# Patient Record
Sex: Male | Born: 1960 | Race: White | Hispanic: No | Smoking: Former smoker
Health system: Southern US, Community
[De-identification: ages and names within clinical notes are randomized; demographics above are authoritative.]

## PROBLEM LIST (undated history)

## (undated) DIAGNOSIS — G4733 Obstructive sleep apnea (adult) (pediatric): Secondary | ICD-10-CM

## (undated) HISTORY — DX: Obstructive sleep apnea (adult) (pediatric): G47.33

## (undated) HISTORY — DX: Morbid (severe) obesity due to excess calories: E66.01

---

## 2003-06-06 ENCOUNTER — Inpatient Hospital Stay (HOSPITAL_COMMUNITY): Admission: AC | Admit: 2003-06-06 | Discharge: 2003-06-09 | Payer: Self-pay
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
## 2012-03-13 LAB — HM COLONOSCOPY

## 2017-12-31 DIAGNOSIS — Z6841 Body Mass Index (BMI) 40.0 and over, adult: Secondary | ICD-10-CM | POA: Diagnosis not present

## 2017-12-31 DIAGNOSIS — R600 Localized edema: Secondary | ICD-10-CM | POA: Diagnosis not present

## 2017-12-31 DIAGNOSIS — Z125 Encounter for screening for malignant neoplasm of prostate: Secondary | ICD-10-CM | POA: Diagnosis not present

## 2017-12-31 DIAGNOSIS — Z0000 Encounter for general adult medical examination without abnormal findings: Secondary | ICD-10-CM | POA: Diagnosis not present

## 2018-01-19 DIAGNOSIS — L300 Nummular dermatitis: Secondary | ICD-10-CM | POA: Diagnosis not present

## 2018-01-19 DIAGNOSIS — I8310 Varicose veins of unspecified lower extremity with inflammation: Secondary | ICD-10-CM | POA: Diagnosis not present

## 2018-01-19 DIAGNOSIS — C4441 Basal cell carcinoma of skin of scalp and neck: Secondary | ICD-10-CM | POA: Diagnosis not present

## 2018-01-19 DIAGNOSIS — L578 Other skin changes due to chronic exposure to nonionizing radiation: Secondary | ICD-10-CM | POA: Diagnosis not present

## 2018-02-03 DIAGNOSIS — C4441 Basal cell carcinoma of skin of scalp and neck: Secondary | ICD-10-CM | POA: Diagnosis not present

## 2018-05-20 DIAGNOSIS — L570 Actinic keratosis: Secondary | ICD-10-CM | POA: Diagnosis not present

## 2018-05-21 DIAGNOSIS — H6012 Cellulitis of left external ear: Secondary | ICD-10-CM | POA: Diagnosis not present

## 2018-05-21 DIAGNOSIS — M25511 Pain in right shoulder: Secondary | ICD-10-CM | POA: Diagnosis not present

## 2018-07-11 DIAGNOSIS — K8020 Calculus of gallbladder without cholecystitis without obstruction: Secondary | ICD-10-CM | POA: Diagnosis not present

## 2018-07-11 DIAGNOSIS — R1011 Right upper quadrant pain: Secondary | ICD-10-CM | POA: Diagnosis not present

## 2018-07-11 DIAGNOSIS — R110 Nausea: Secondary | ICD-10-CM | POA: Diagnosis not present

## 2018-07-11 DIAGNOSIS — K7689 Other specified diseases of liver: Secondary | ICD-10-CM | POA: Diagnosis not present

## 2018-07-17 DIAGNOSIS — K8000 Calculus of gallbladder with acute cholecystitis without obstruction: Secondary | ICD-10-CM | POA: Diagnosis not present

## 2018-07-17 DIAGNOSIS — R1011 Right upper quadrant pain: Secondary | ICD-10-CM | POA: Diagnosis not present

## 2018-07-29 DIAGNOSIS — R1011 Right upper quadrant pain: Secondary | ICD-10-CM | POA: Diagnosis not present

## 2018-07-29 DIAGNOSIS — K8010 Calculus of gallbladder with chronic cholecystitis without obstruction: Secondary | ICD-10-CM | POA: Diagnosis not present

## 2018-07-29 DIAGNOSIS — I10 Essential (primary) hypertension: Secondary | ICD-10-CM | POA: Diagnosis not present

## 2018-07-29 DIAGNOSIS — R110 Nausea: Secondary | ICD-10-CM | POA: Diagnosis not present

## 2018-08-13 DIAGNOSIS — Z01810 Encounter for preprocedural cardiovascular examination: Secondary | ICD-10-CM | POA: Diagnosis not present

## 2018-08-13 DIAGNOSIS — K811 Chronic cholecystitis: Secondary | ICD-10-CM | POA: Diagnosis not present

## 2018-08-13 DIAGNOSIS — R109 Unspecified abdominal pain: Secondary | ICD-10-CM | POA: Diagnosis not present

## 2018-08-17 DIAGNOSIS — G4730 Sleep apnea, unspecified: Secondary | ICD-10-CM | POA: Diagnosis not present

## 2018-08-17 DIAGNOSIS — K8020 Calculus of gallbladder without cholecystitis without obstruction: Secondary | ICD-10-CM | POA: Diagnosis not present

## 2018-08-17 DIAGNOSIS — K8010 Calculus of gallbladder with chronic cholecystitis without obstruction: Secondary | ICD-10-CM | POA: Diagnosis not present

## 2018-08-17 DIAGNOSIS — K811 Chronic cholecystitis: Secondary | ICD-10-CM | POA: Diagnosis not present

## 2018-08-17 DIAGNOSIS — Z9989 Dependence on other enabling machines and devices: Secondary | ICD-10-CM | POA: Diagnosis not present

## 2018-08-17 DIAGNOSIS — I10 Essential (primary) hypertension: Secondary | ICD-10-CM | POA: Diagnosis not present

## 2018-12-16 DIAGNOSIS — L570 Actinic keratosis: Secondary | ICD-10-CM | POA: Diagnosis not present

## 2019-03-09 DIAGNOSIS — H60539 Acute contact otitis externa, unspecified ear: Secondary | ICD-10-CM | POA: Diagnosis not present

## 2019-08-09 DIAGNOSIS — Z6841 Body Mass Index (BMI) 40.0 and over, adult: Secondary | ICD-10-CM | POA: Diagnosis not present

## 2019-08-09 DIAGNOSIS — G4733 Obstructive sleep apnea (adult) (pediatric): Secondary | ICD-10-CM | POA: Diagnosis not present

## 2019-08-09 DIAGNOSIS — J4520 Mild intermittent asthma, uncomplicated: Secondary | ICD-10-CM | POA: Diagnosis not present

## 2019-08-09 DIAGNOSIS — Z1159 Encounter for screening for other viral diseases: Secondary | ICD-10-CM | POA: Diagnosis not present

## 2019-08-27 DIAGNOSIS — G4733 Obstructive sleep apnea (adult) (pediatric): Secondary | ICD-10-CM | POA: Diagnosis not present

## 2019-08-27 DIAGNOSIS — J4530 Mild persistent asthma, uncomplicated: Secondary | ICD-10-CM | POA: Diagnosis not present

## 2019-08-27 DIAGNOSIS — E662 Morbid (severe) obesity with alveolar hypoventilation: Secondary | ICD-10-CM | POA: Diagnosis not present

## 2019-08-27 DIAGNOSIS — R5383 Other fatigue: Secondary | ICD-10-CM | POA: Diagnosis not present

## 2019-09-06 DIAGNOSIS — M940 Chondrocostal junction syndrome [Tietze]: Secondary | ICD-10-CM | POA: Diagnosis not present

## 2019-09-11 DIAGNOSIS — G4733 Obstructive sleep apnea (adult) (pediatric): Secondary | ICD-10-CM | POA: Diagnosis not present

## 2019-09-15 DIAGNOSIS — J4530 Mild persistent asthma, uncomplicated: Secondary | ICD-10-CM | POA: Diagnosis not present

## 2019-09-15 DIAGNOSIS — R5383 Other fatigue: Secondary | ICD-10-CM | POA: Diagnosis not present

## 2019-09-15 DIAGNOSIS — E662 Morbid (severe) obesity with alveolar hypoventilation: Secondary | ICD-10-CM | POA: Diagnosis not present

## 2019-09-15 DIAGNOSIS — G4733 Obstructive sleep apnea (adult) (pediatric): Secondary | ICD-10-CM | POA: Diagnosis not present

## 2019-10-09 DIAGNOSIS — G4733 Obstructive sleep apnea (adult) (pediatric): Secondary | ICD-10-CM | POA: Diagnosis not present

## 2019-10-15 DIAGNOSIS — J4530 Mild persistent asthma, uncomplicated: Secondary | ICD-10-CM | POA: Diagnosis not present

## 2019-10-15 DIAGNOSIS — R5383 Other fatigue: Secondary | ICD-10-CM | POA: Diagnosis not present

## 2019-10-15 DIAGNOSIS — G4733 Obstructive sleep apnea (adult) (pediatric): Secondary | ICD-10-CM | POA: Diagnosis not present

## 2019-10-15 DIAGNOSIS — E662 Morbid (severe) obesity with alveolar hypoventilation: Secondary | ICD-10-CM | POA: Diagnosis not present

## 2019-10-17 DIAGNOSIS — M26629 Arthralgia of temporomandibular joint, unspecified side: Secondary | ICD-10-CM | POA: Diagnosis not present

## 2019-11-07 DIAGNOSIS — J4520 Mild intermittent asthma, uncomplicated: Secondary | ICD-10-CM | POA: Insufficient documentation

## 2019-11-07 DIAGNOSIS — G4733 Obstructive sleep apnea (adult) (pediatric): Secondary | ICD-10-CM | POA: Insufficient documentation

## 2019-11-08 ENCOUNTER — Ambulatory Visit: Payer: Self-pay | Admitting: Legal Medicine

## 2019-11-10 DIAGNOSIS — G4733 Obstructive sleep apnea (adult) (pediatric): Secondary | ICD-10-CM | POA: Diagnosis not present

## 2019-12-10 DIAGNOSIS — G4733 Obstructive sleep apnea (adult) (pediatric): Secondary | ICD-10-CM | POA: Diagnosis not present

## 2019-12-15 DIAGNOSIS — E662 Morbid (severe) obesity with alveolar hypoventilation: Secondary | ICD-10-CM | POA: Diagnosis not present

## 2019-12-15 DIAGNOSIS — G4733 Obstructive sleep apnea (adult) (pediatric): Secondary | ICD-10-CM | POA: Diagnosis not present

## 2019-12-15 DIAGNOSIS — J4530 Mild persistent asthma, uncomplicated: Secondary | ICD-10-CM | POA: Diagnosis not present

## 2019-12-15 DIAGNOSIS — R5383 Other fatigue: Secondary | ICD-10-CM | POA: Diagnosis not present

## 2020-01-10 DIAGNOSIS — G4733 Obstructive sleep apnea (adult) (pediatric): Secondary | ICD-10-CM | POA: Diagnosis not present

## 2020-01-17 ENCOUNTER — Ambulatory Visit: Payer: BC Managed Care – PPO | Admitting: Legal Medicine

## 2020-01-17 ENCOUNTER — Encounter: Payer: Self-pay | Admitting: Legal Medicine

## 2020-01-17 ENCOUNTER — Other Ambulatory Visit: Payer: Self-pay

## 2020-01-17 VITALS — BP 130/80 | HR 102 | Temp 97.4°F | Resp 16 | Ht 70.0 in | Wt >= 6400 oz

## 2020-01-17 DIAGNOSIS — R609 Edema, unspecified: Secondary | ICD-10-CM | POA: Diagnosis not present

## 2020-01-17 MED ORDER — FUROSEMIDE 20 MG PO TABS: 20.0000 mg | ORAL_TABLET | Freq: Every day | ORAL | 2 refills | Status: DC

## 2020-01-17 NOTE — Progress Notes (Signed)
Subjective:  Patient ID: Victor Hamilton, male    DOB: 06-Nov-1960  Age: 59 y.o. MRN: [id number]  Chief Complaint  Patient presents with  . Edema    HPI: peripheral edema.  Worse with warm weather.  He is cutting down on salt but remains overweight.   Current Outpatient Medications on File Prior to Visit  Medication Sig Dispense Refill  . albuterol (VENTOLIN HFA) 108 (90 Base) MCG/ACT inhaler Inhale into the lungs.    Marland Kitchen BREO ELLIPTA 100-25 MCG/INH AEPB SMARTSIG:1 Inhalation Via Inhaler Daily    . cyanocobalamin 1000 MCG tablet Take by mouth.    . loratadine (CLARITIN) 10 MG tablet Take by mouth.    . Potassium Gluconate 2.5 MEQ TABS Take by mouth.     No current facility-administered medications on file prior to visit.   Past Medical History:  Diagnosis Date  . Morbid obesity due to excess calories (HCC)   . Obstructive sleep apnea (adult) (pediatric)    History reviewed. No pertinent surgical history.  Family History  Problem Relation Age of Onset  . Transient ischemic attack Mother   . Hypertension Father   . Heart attack Father   . Diabetes Father    Social History   Socioeconomic History  . Marital status: Divorced    Spouse name: Not on file  . Number of children: 1  . Years of education: Not on file  . Highest education level: Not on file  Occupational History  . Not on file  Tobacco Use  . Smoking status: Never Smoker  . Smokeless tobacco: Never Used  Vaping Use  . Vaping Use: Never used  Substance and Sexual Activity  . Alcohol use: Not Currently  . Drug use: Not Currently  . Sexual activity: Not on file  Other Topics Concern  . Not on file  Social History Narrative  . Not on file   Social Determinants of Health   Financial Resource Strain:   . Difficulty of Paying Living Expenses:   Food Insecurity:   . Worried About Programme researcher, broadcasting/film/video in the Last Year:   . Barista in the Last Year:   Transportation Needs:   . Freight forwarder  (Medical):   Marland Kitchen Lack of Transportation (Non-Medical):   Physical Activity:   . Days of Exercise per Week:   . Minutes of Exercise per Session:   Stress:   . Feeling of Stress :   Social Connections:   . Frequency of Communication with Friends and Family:   . Frequency of Social Gatherings with Friends and Family:   . Attends Religious Services:   . Active Member of Clubs or Organizations:   . Attends Banker Meetings:   Marland Kitchen Marital Status:     Review of Systems  Constitutional: Negative.   HENT: Negative.   Eyes: Negative.   Respiratory: Negative.   Cardiovascular: Negative.   Gastrointestinal: Negative.   Genitourinary: Negative.   Musculoskeletal:       Peripheral edema  Neurological: Negative.   Psychiatric/Behavioral: Negative.      Objective:  BP 130/80   Pulse (!) 102   Temp (!) 97.4 F (36.3 C)   Resp 16   Ht 5\' 10"  (1.778 m)   Wt (!) 408 lb (185.1 kg)   SpO2 93%   BMI 58.54 kg/m   BP/Weight 01/17/2020  Systolic BP 130  Diastolic BP 80  Wt. (Lbs) 408  BMI 58.54    Physical Exam  Vitals reviewed.  Constitutional:      Appearance: Normal appearance.  Cardiovascular:     Rate and Rhythm: Normal rate and regular rhythm.     Pulses: Normal pulses.     Heart sounds: Normal heart sounds.  Pulmonary:     Effort: Pulmonary effort is normal.     Breath sounds: Normal breath sounds.  Musculoskeletal:     Cervical back: Normal range of motion and neck supple.  Skin:    General: Skin is warm and dry.     Comments: 2+ pitting edema both legs  Neurological:     Mental Status: He is alert.       No results found for: WBC, HGB, HCT, PLT, GLUCOSE, CHOL, TRIG, HDL, LDLDIRECT, LDLCALC, ALT, AST, NA, K, CL, CREATININE, BUN, CO2, TSH, PSA, INR, GLUF, HGBA1C, MICROALBUR    Assessment & Plan:   1. Morbid obesity due to excess calories [facility]) An individualize plan was formulated for obesity using patient history and physical exam to encourage  weight loss.  An evidence based program was formulated.  Patient is to cut portion size with meals and to plan physical exercise 3 days a week at least 20 minutes.  Weight watchers and other programs are helpful.  Planned amount of weight loss 10 lbs.  2. Peripheral edema - furosemide (LASIX) 20 MG tablet; Take 1 tablet (20 mg total) by mouth daily.  Dispense: 30 tablet; Refill: 2 3+ edema both legs, start diuretic and get compression hose.  I gave him note to sit every 2 hours due to weight      Meds ordered this encounter  Medications  . furosemide (LASIX) 20 MG tablet    Sig: Take 1 tablet (20 mg total) by mouth daily.    Dispense:  30 tablet    Refill:  2    No orders of the defined types were placed in this encounter.    Follow-up: Return in about 3 months (around 04/18/2020) for fasting.  An After Visit Summary was printed and given to the patient.  Brent Bulla [facility] [phone]

## 2020-02-09 DIAGNOSIS — G4733 Obstructive sleep apnea (adult) (pediatric): Secondary | ICD-10-CM | POA: Diagnosis not present

## 2020-03-11 DIAGNOSIS — G4733 Obstructive sleep apnea (adult) (pediatric): Secondary | ICD-10-CM | POA: Diagnosis not present

## 2020-03-21 DIAGNOSIS — G4733 Obstructive sleep apnea (adult) (pediatric): Secondary | ICD-10-CM | POA: Diagnosis not present

## 2020-04-09 ENCOUNTER — Other Ambulatory Visit: Payer: Self-pay | Admitting: Legal Medicine

## 2020-04-09 DIAGNOSIS — R609 Edema, unspecified: Secondary | ICD-10-CM

## 2020-04-11 DIAGNOSIS — G4733 Obstructive sleep apnea (adult) (pediatric): Secondary | ICD-10-CM | POA: Diagnosis not present

## 2020-04-19 ENCOUNTER — Ambulatory Visit: Payer: BC Managed Care – PPO | Admitting: Legal Medicine

## 2020-05-11 DIAGNOSIS — G4733 Obstructive sleep apnea (adult) (pediatric): Secondary | ICD-10-CM | POA: Diagnosis not present

## 2020-05-25 ENCOUNTER — Ambulatory Visit: Payer: BC Managed Care – PPO | Admitting: Legal Medicine

## 2020-05-25 ENCOUNTER — Encounter: Payer: Self-pay | Admitting: Legal Medicine

## 2020-05-25 ENCOUNTER — Other Ambulatory Visit: Payer: Self-pay

## 2020-05-25 VITALS — BP 128/80 | HR 96 | Temp 97.4°F | Resp 16 | Ht 70.0 in | Wt >= 6400 oz

## 2020-05-25 DIAGNOSIS — Z13220 Encounter for screening for lipoid disorders: Secondary | ICD-10-CM | POA: Diagnosis not present

## 2020-05-25 DIAGNOSIS — Z23 Encounter for immunization: Secondary | ICD-10-CM

## 2020-05-25 DIAGNOSIS — J4520 Mild intermittent asthma, uncomplicated: Secondary | ICD-10-CM

## 2020-05-25 DIAGNOSIS — G4733 Obstructive sleep apnea (adult) (pediatric): Secondary | ICD-10-CM

## 2020-05-25 NOTE — Progress Notes (Signed)
Subjective:  Patient ID: Victor Hamilton, male    DOB: 1960/10/08  Age: 59 y.o. MRN: [id number]  Chief Complaint  Patient presents with  . Edema    3 month follow up, both legs still swollen    HPI: chronic visit  Patient has moderate intermettant asthma uncomplicated.  Asthma was diagnosed adult and has frequent daytime episodes and frequent night symptoms.  Patient is no having an acute attack and is using breo, albuterol.  Patient nonsmoker.  None  OSA and on bipap and using regularly.  He is doing better and more awake  Chronic venous hypertension with chronic edema and using compression hose and furosemide.    Current Outpatient Medications on File Prior to Visit  Medication Sig Dispense Refill  . albuterol (VENTOLIN HFA) 108 (90 Base) MCG/ACT inhaler Inhale into the lungs.    Marland Kitchen BREO ELLIPTA 100-25 MCG/INH AEPB SMARTSIG:1 Inhalation Via Inhaler Daily    . cyanocobalamin 1000 MCG tablet Take by mouth.    . furosemide (LASIX) 20 MG tablet TAKE 1 TABLET BY MOUTH EVERY DAY 90 tablet 2  . loratadine (CLARITIN) 10 MG tablet Take by mouth.    . Potassium Gluconate 2.5 MEQ TABS Take by mouth.     No current facility-administered medications on file prior to visit.   Past Medical History:  Diagnosis Date  . Morbid obesity due to excess calories (HCC)   . Obstructive sleep apnea (adult) (pediatric)    History reviewed. No pertinent surgical history.  Family History  Problem Relation Age of Onset  . Transient ischemic attack Mother   . Hypertension Father   . Heart attack Father   . Diabetes Father    Social History   Socioeconomic History  . Marital status: Divorced    Spouse name: Not on file  . Number of children: 1  . Years of education: Not on file  . Highest education level: Not on file  Occupational History  . Not on file  Tobacco Use  . Smoking status: Never Smoker  . Smokeless tobacco: Never Used  Vaping Use  . Vaping Use: Never used  Substance and Sexual  Activity  . Alcohol use: Not Currently  . Drug use: Not Currently  . Sexual activity: Not on file  Other Topics Concern  . Not on file  Social History Narrative  . Not on file   Social Determinants of Health   Financial Resource Strain:   . Difficulty of Paying Living Expenses: Not on file  Food Insecurity:   . Worried About Programme researcher, broadcasting/film/video in the Last Year: Not on file  . Ran Out of Food in the Last Year: Not on file  Transportation Needs:   . Lack of Transportation (Medical): Not on file  . Lack of Transportation (Non-Medical): Not on file  Physical Activity:   . Days of Exercise per Week: Not on file  . Minutes of Exercise per Session: Not on file  Stress:   . Feeling of Stress : Not on file  Social Connections:   . Frequency of Communication with Friends and Family: Not on file  . Frequency of Social Gatherings with Friends and Family: Not on file  . Attends Religious Services: Not on file  . Active Member of Clubs or Organizations: Not on file  . Attends Banker Meetings: Not on file  . Marital Status: Not on file    Review of Systems  Constitutional: Negative for activity change and appetite change.  Eyes: Negative.   Respiratory: Positive for shortness of breath.   Cardiovascular: Positive for leg swelling. Negative for chest pain and palpitations.  Gastrointestinal: Negative.   Endocrine: Negative.   Genitourinary: Negative.   Musculoskeletal: Negative.   Skin: Negative.   Neurological: Negative.   Psychiatric/Behavioral: Negative.      Objective:  BP 128/80   Pulse 96   Temp (!) 97.4 F (36.3 C)   Resp 16   Ht 5\' 10"  (1.778 m)   Wt (!) 404 lb 3.2 oz (183.3 kg)   SpO2 100%   BMI 58.00 kg/m   BP/Weight 05/25/2020 01/17/2020  Systolic BP 128 130  Diastolic BP 80 80  Wt. (Lbs) 404.2 408  BMI 58 58.54    Physical Exam Vitals reviewed.  Constitutional:      Appearance: Normal appearance.  HENT:     Head: Normocephalic and  atraumatic.     Right Ear: Tympanic membrane, ear canal and external ear normal.     Left Ear: Tympanic membrane, ear canal and external ear normal.     Mouth/Throat:     Mouth: Mucous membranes are moist.     Pharynx: Oropharynx is clear.  Eyes:     Extraocular Movements: Extraocular movements intact.     Conjunctiva/sclera: Conjunctivae normal.     Pupils: Pupils are equal, round, and reactive to light.  Cardiovascular:     Rate and Rhythm: Normal rate and regular rhythm.     Pulses: Normal pulses.     Heart sounds: Normal heart sounds.  Pulmonary:     Effort: Pulmonary effort is normal.     Breath sounds: Normal breath sounds.  Abdominal:     General: Abdomen is flat. Bowel sounds are normal.     Palpations: Abdomen is soft.  Musculoskeletal:        General: Normal range of motion.     Cervical back: Normal range of motion and neck supple.  Skin:    General: Skin is warm and dry.     Capillary Refill: Capillary refill takes less than 2 seconds.     Comments: Diffuse edema  Neurological:     General: No focal deficit present.     Mental Status: He is alert. Mental status is at baseline. He is disoriented.  Psychiatric:        Mood and Affect: Mood normal.        Thought Content: Thought content normal.       No results found for: WBC, HGB, HCT, PLT, GLUCOSE, CHOL, TRIG, HDL, LDLDIRECT, LDLCALC, ALT, AST, NA, K, CL, CREATININE, BUN, CO2, TSH, PSA, INR, GLUF, HGBA1C, MICROALBUR    Assessment & Plan:   1. Mild intermittent intrinsic asthma without status asthmaticus without complication - CBC with Differential/Platelet - Comprehensive metabolic panel This patient has asthma moderate and is on breo.  Patient is not having a flair.  Chronic medicines include breo and albuterol. Addition new medicines none.  Asthma action plan is in place.   2. Obstructive sleep apnea (adult) (pediatric) - CBC with Differential/Platelet - Comprehensive metabolic panel PATIENT HAS  SEVERE SLEEP APNEA AND IS ON BIPAP  3. Morbid obesity (HCC) An individualize plan was formulated for obesity using patient history and physical exam to encourage weight loss.  An evidence based program was formulated.  Patient is to cut portion size with meals and to plan physical exercise 3 days a week at least 20 minutes.  Weight watchers and other programs are helpful.  Planned amount  of weight loss 10 lbs.  4. Screening cholesterol level - Lipid panel         Follow-up: Return in about 6 months (around 11/22/2020) for fasting.  An After Visit Summary was printed and given to the patient.  Brent Bulla [facility] [phone]

## 2020-05-26 ENCOUNTER — Encounter: Payer: Self-pay | Admitting: Legal Medicine

## 2020-05-26 LAB — CBC WITH DIFFERENTIAL/PLATELET
Basophils Absolute: 0.1 10*3/uL (ref 0.0–0.2)
Basos: 1 %
EOS (ABSOLUTE): 0.3 10*3/uL (ref 0.0–0.4)
Eos: 3 %
Hematocrit: 43 % (ref 37.5–51.0)
Hemoglobin: 14.2 g/dL (ref 13.0–17.7)
Immature Grans (Abs): 0 10*3/uL (ref 0.0–0.1)
Immature Granulocytes: 0 %
Lymphocytes Absolute: 1.1 10*3/uL (ref 0.7–3.1)
Lymphs: 13 %
MCH: 27.8 pg (ref 26.6–33.0)
MCHC: 33 g/dL (ref 31.5–35.7)
MCV: 84 fL (ref 79–97)
Monocytes Absolute: 0.9 10*3/uL (ref 0.1–0.9)
Monocytes: 11 %
Neutrophils Absolute: 5.7 10*3/uL (ref 1.4–7.0)
Neutrophils: 72 %
Platelets: 280 10*3/uL (ref 150–450)
RBC: 5.11 x10E6/uL (ref 4.14–5.80)
RDW: 13.6 % (ref 11.6–15.4)
WBC: 8 10*3/uL (ref 3.4–10.8)

## 2020-05-26 LAB — COMPREHENSIVE METABOLIC PANEL
ALT: 20 IU/L (ref 0–44)
AST: 18 IU/L (ref 0–40)
Albumin/Globulin Ratio: 1.4 (ref 1.2–2.2)
Albumin: 3.9 g/dL (ref 3.8–4.9)
Alkaline Phosphatase: 87 IU/L (ref 44–121)
BUN/Creatinine Ratio: 14 (ref 9–20)
BUN: 13 mg/dL (ref 6–24)
Bilirubin Total: 0.3 mg/dL (ref 0.0–1.2)
CO2: 26 mmol/L (ref 20–29)
Calcium: 8.7 mg/dL (ref 8.7–10.2)
Chloride: 102 mmol/L (ref 96–106)
Creatinine, Ser: 0.93 mg/dL (ref 0.76–1.27)
GFR calc Af Amer: 104 mL/min/{1.73_m2} (ref >59)
GFR calc non Af Amer: 90 mL/min/{1.73_m2} (ref >59)
Globulin, Total: 2.8 g/dL (ref 1.5–4.5)
Glucose: 111 mg/dL — ABNORMAL HIGH (ref 65–99)
Potassium: 5.2 mmol/L (ref 3.5–5.2)
Sodium: 140 mmol/L (ref 134–144)
Total Protein: 6.7 g/dL (ref 6.0–8.5)

## 2020-05-26 LAB — LIPID PANEL
Chol/HDL Ratio: 4 ratio (ref 0.0–5.0)
Cholesterol, Total: 137 mg/dL (ref 100–199)
HDL: 34 mg/dL — ABNORMAL LOW (ref >39)
LDL Chol Calc (NIH): 88 mg/dL (ref 0–99)
Triglycerides: 75 mg/dL (ref 0–149)
VLDL Cholesterol Cal: 15 mg/dL (ref 5–40)

## 2020-05-26 LAB — CARDIOVASCULAR RISK ASSESSMENT

## 2020-05-26 NOTE — Progress Notes (Signed)
CBC normal, glucose 111, kidney and liver tests normal, Cholesterol normal,  lp

## 2020-05-29 LAB — HGB A1C W/O EAG: Hgb A1c MFr Bld: 6.2 % — ABNORMAL HIGH (ref 4.8–5.6)

## 2020-05-29 LAB — SPECIMEN STATUS REPORT

## 2020-05-29 NOTE — Progress Notes (Signed)
A1c 6.2 prediabetes level, needs nurse visit to discuss glucose management

## 2020-05-30 ENCOUNTER — Telehealth: Payer: Self-pay

## 2020-05-30 NOTE — Telephone Encounter (Signed)
I left voicemail to call us back for the results of HbA1C.

## 2020-05-31 DIAGNOSIS — R5383 Other fatigue: Secondary | ICD-10-CM | POA: Diagnosis not present

## 2020-05-31 DIAGNOSIS — E662 Morbid (severe) obesity with alveolar hypoventilation: Secondary | ICD-10-CM | POA: Diagnosis not present

## 2020-05-31 DIAGNOSIS — J4530 Mild persistent asthma, uncomplicated: Secondary | ICD-10-CM | POA: Diagnosis not present

## 2020-05-31 DIAGNOSIS — G4733 Obstructive sleep apnea (adult) (pediatric): Secondary | ICD-10-CM | POA: Diagnosis not present

## 2020-06-23 DIAGNOSIS — G4733 Obstructive sleep apnea (adult) (pediatric): Secondary | ICD-10-CM | POA: Diagnosis not present

## 2020-07-09 ENCOUNTER — Encounter: Payer: Self-pay | Admitting: Legal Medicine

## 2020-07-11 DIAGNOSIS — G4733 Obstructive sleep apnea (adult) (pediatric): Secondary | ICD-10-CM | POA: Diagnosis not present

## 2020-07-31 DIAGNOSIS — R059 Cough, unspecified: Secondary | ICD-10-CM | POA: Diagnosis not present

## 2020-07-31 DIAGNOSIS — J069 Acute upper respiratory infection, unspecified: Secondary | ICD-10-CM | POA: Diagnosis not present

## 2020-07-31 DIAGNOSIS — K591 Functional diarrhea: Secondary | ICD-10-CM | POA: Diagnosis not present

## 2020-07-31 DIAGNOSIS — A78 Q fever: Secondary | ICD-10-CM | POA: Diagnosis not present

## 2020-07-31 DIAGNOSIS — R519 Headache, unspecified: Secondary | ICD-10-CM | POA: Diagnosis not present

## 2020-07-31 DIAGNOSIS — M791 Myalgia, unspecified site: Secondary | ICD-10-CM | POA: Diagnosis not present

## 2020-07-31 DIAGNOSIS — Z20828 Contact with and (suspected) exposure to other viral communicable diseases: Secondary | ICD-10-CM | POA: Diagnosis not present

## 2020-07-31 DIAGNOSIS — R197 Diarrhea, unspecified: Secondary | ICD-10-CM | POA: Diagnosis not present

## 2020-08-11 DIAGNOSIS — G4733 Obstructive sleep apnea (adult) (pediatric): Secondary | ICD-10-CM | POA: Diagnosis not present

## 2020-10-16 DIAGNOSIS — G4733 Obstructive sleep apnea (adult) (pediatric): Secondary | ICD-10-CM | POA: Diagnosis not present

## 2020-11-30 ENCOUNTER — Ambulatory Visit: Payer: BC Managed Care – PPO | Admitting: Legal Medicine

## 2020-12-20 ENCOUNTER — Encounter: Payer: Self-pay | Admitting: Legal Medicine

## 2020-12-20 ENCOUNTER — Other Ambulatory Visit: Payer: Self-pay

## 2020-12-20 ENCOUNTER — Ambulatory Visit: Payer: BC Managed Care – PPO | Admitting: Legal Medicine

## 2020-12-20 ENCOUNTER — Ambulatory Visit (INDEPENDENT_AMBULATORY_CARE_PROVIDER_SITE_OTHER): Payer: BC Managed Care – PPO

## 2020-12-20 VITALS — BP 148/90 | HR 84 | Temp 97.5°F | Ht 70.0 in | Wt >= 6400 oz

## 2020-12-20 DIAGNOSIS — R609 Edema, unspecified: Secondary | ICD-10-CM

## 2020-12-20 DIAGNOSIS — Z23 Encounter for immunization: Secondary | ICD-10-CM | POA: Diagnosis not present

## 2020-12-20 DIAGNOSIS — N400 Enlarged prostate without lower urinary tract symptoms: Secondary | ICD-10-CM | POA: Insufficient documentation

## 2020-12-20 DIAGNOSIS — J4520 Mild intermittent asthma, uncomplicated: Secondary | ICD-10-CM | POA: Diagnosis not present

## 2020-12-20 DIAGNOSIS — G4733 Obstructive sleep apnea (adult) (pediatric): Secondary | ICD-10-CM

## 2020-12-20 DIAGNOSIS — Z6841 Body Mass Index (BMI) 40.0 and over, adult: Secondary | ICD-10-CM | POA: Insufficient documentation

## 2020-12-20 MED ORDER — ALBUTEROL SULFATE HFA 108 (90 BASE) MCG/ACT IN AERS: 2.0000 | INHALATION_SPRAY | Freq: Four times a day (QID) | RESPIRATORY_TRACT | 6 refills | Status: DC | PRN

## 2020-12-20 NOTE — Progress Notes (Signed)
Subjective:  Patient ID: Victor Hamilton, male    DOB: [birth year]  Age: 60 y.o. MRN: [id number]  Chief Complaint  Patient presents with  . Asthma    HPI: chronic visit  Patient has moderate intermittant asthma uncomplicated.  Asthma was diagnosed child and has ocassional daytime episodes and ocassional night symptoms.  Patient is not having an attack and is using breo and albuterol.  Patient does not soke.  None  Sleep apnea doing well on BiPAP   Current Outpatient Medications on File Prior to Visit  Medication Sig Dispense Refill  . BREO ELLIPTA 100-25 MCG/INH AEPB SMARTSIG:1 Inhalation Via Inhaler Daily    . cyanocobalamin 1000 MCG tablet Take by mouth.    . furosemide (LASIX) 20 MG tablet TAKE 1 TABLET BY MOUTH EVERY DAY 90 tablet 2  . loratadine (CLARITIN) 10 MG tablet Take by mouth.    . Potassium Gluconate 2.5 MEQ TABS Take by mouth.     No current facility-administered medications on file prior to visit.   Past Medical History:  Diagnosis Date  . Morbid obesity due to excess calories (HCC)   . Obstructive sleep apnea (adult) (pediatric)    History reviewed. No pertinent surgical history.  Family History  Problem Relation Age of Onset  . Transient ischemic attack Mother   . Hypertension Father   . Heart attack Father   . Diabetes Father    Social History   Socioeconomic History  . Marital status: Divorced    Spouse name: Not on file  . Number of children: 1  . Years of education: Not on file  . Highest education level: Not on file  Occupational History  . Not on file  Tobacco Use  . Smoking status: Never Smoker  . Smokeless tobacco: Never Used  Vaping Use  . Vaping Use: Never used  Substance and Sexual Activity  . Alcohol use: Not Currently  . Drug use: Not Currently  . Sexual activity: Not on file  Other Topics Concern  . Not on file  Social History Narrative  . Not on file   Social Determinants of Health   Financial Resource Strain: Not on file   Food Insecurity: Not on file  Transportation Needs: Not on file  Physical Activity: Not on file  Stress: Not on file  Social Connections: Not on file    Review of Systems  Constitutional: Negative for chills, diaphoresis, fatigue and fever.  HENT: Negative for congestion, ear pain and sore throat.   Eyes: Negative for visual disturbance.  Respiratory: Negative for cough and shortness of breath.   Cardiovascular: Negative for chest pain and leg swelling.  Gastrointestinal: Negative for abdominal pain, constipation, diarrhea, nausea and vomiting.  Endocrine: Negative for polyuria.  Genitourinary: Negative for dysuria and urgency.  Musculoskeletal: Negative for arthralgias and myalgias.  Skin: Negative.   Neurological: Negative for dizziness and headaches.  Hematological: Negative.   Psychiatric/Behavioral: Negative for dysphoric mood.     Objective:  BP (!) 148/90   Pulse 84   Temp (!) 97.5 F (36.4 C)   Ht 5\' 10"  (1.778 m)   Wt (!) 412 lb (186.9 kg)   SpO2 93%   BMI 59.12 kg/m   BP/Weight 12/20/2020 05/25/2020 01/17/2020  Systolic BP 148 128 130  Diastolic BP 90 80 80  Wt. (Lbs) 412 404.2 408  BMI 59.12 58 58.54    Physical Exam Vitals reviewed.  Constitutional:      Appearance: Normal appearance. He is obese.  HENT:     Head: Normocephalic and atraumatic.     Right Ear: Tympanic membrane normal.     Left Ear: Tympanic membrane normal.     Nose: Nose normal.     Mouth/Throat:     Mouth: Mucous membranes are moist.     Pharynx: Oropharynx is clear.  Eyes:     Extraocular Movements: Extraocular movements intact.     Conjunctiva/sclera: Conjunctivae normal.     Pupils: Pupils are equal, round, and reactive to light.  Cardiovascular:     Rate and Rhythm: Normal rate and regular rhythm.     Pulses: Normal pulses.     Heart sounds: No murmur heard. No gallop.   Pulmonary:     Effort: Pulmonary effort is normal. No respiratory distress.     Breath sounds: No  rales.  Abdominal:     General: Abdomen is flat. Bowel sounds are normal. There is no distension.     Palpations: Abdomen is soft.     Tenderness: There is no abdominal tenderness.  Musculoskeletal:        General: Normal range of motion.     Cervical back: Normal range of motion.     Right lower leg: Edema present.     Left lower leg: Edema present.  Skin:    General: Skin is warm.     Capillary Refill: Capillary refill takes less than 2 seconds.  Neurological:     General: No focal deficit present.     Mental Status: He is alert and oriented to person, place, and time. Mental status is at baseline.  Psychiatric:        Mood and Affect: Mood normal.        Thought Content: Thought content normal.        Judgment: Judgment normal.       Lab Results  Component Value Date   WBC 8.0 05/25/2020   HGB 14.2 05/25/2020   HCT 43.0 05/25/2020   PLT 280 05/25/2020   GLUCOSE 111 (H) 05/25/2020   CHOL 137 05/25/2020   TRIG 75 05/25/2020   HDL 34 (L) 05/25/2020   LDLCALC 88 05/25/2020   ALT 20 05/25/2020   AST 18 05/25/2020   NA 140 05/25/2020   K 5.2 05/25/2020   CL 102 05/25/2020   CREATININE 0.93 05/25/2020   BUN 13 05/25/2020   CO2 26 05/25/2020   HGBA1C 6.2 (H) 05/25/2020      Assessment & Plan:   Diagnoses and all orders for this visit: Mild intermittent intrinsic asthma without status asthmaticus without complication -     albuterol (VENTOLIN HFA) 108 (90 Base) MCG/ACT inhaler; Inhale 2 puffs into the lungs every 6 (six) hours as needed for wheezing or shortness of breath. -     CBC with Differential/Platelet -     Comprehensive metabolic panel This patient has asthma moderate and is on breo and albuterol.  Patient is not having a flair.  Chronic medicines include breo. Addition new medicines none.  Asthma action plan is in place.   Obstructive sleep apnea (adult) (pediatric) -     CBC with Differential/Platelet Using CPAP consistently every night and medically  benefiting from its use.  Peripheral edema -     Comprehensive metabolic panel Patient has some edema but it is  controllable.  Morbid obesity due to excess calories (HCC) -     CBC with Differential/Platelet -     Comprehensive metabolic panel An individualize plan was formulated for  obesity using patient history and physical exam to encourage weight loss.  An evidence based program was formulated.  Patient is to cut portion size with meals and to plan physical exercise 3 days a week at least 20 minutes.  Weight watchers and other programs are helpful.  Planned amount of weight loss 10 lbs.  BMI 50.0-59.9, adult [facility]) An individualize plan was formulated for obesity using patient history and physical exam to encourage weight loss.  An evidence based program was formulated.  Patient is to cut portion size with meals and to plan physical exercise 3 days a week at least 20 minutes.  Weight watchers and other programs are helpful.  Planned amount of weight loss 10 lbs. We dis discuss weight loss  Benign prostatic hyperplasia without lower urinary tract symptoms -     PSA BPH with no symptoms       Follow-up: Return in about 6 months (around 06/21/2021).  An After Visit Summary was printed and given to the patient.  Brent Bulla, MD [facility] [phone]

## 2020-12-20 NOTE — Progress Notes (Signed)
   Covid-19 Vaccination Clinic  Name:  Victor Hamilton    MRN: [id number] DOB: [birth year]  12/20/2020  Victor Hamilton was observed post Covid-19 immunization for 15 minutes without incident. He was provided with Vaccine Information Sheet and instruction to access the V-Safe system.   Victor Hamilton was instructed to call 911 with any severe reactions post vaccine: Marland Kitchen Difficulty breathing  . Swelling of face and throat  . A fast heartbeat  . A bad rash all over body  . Dizziness and weakness   Immunizations Administered    Name Date Dose VIS Date Route   PFIZER Comrnaty(Gray TOP) Covid-19 Vaccine 12/20/2020  8:43 AM 0.3 mL 06/29/2020 Intramuscular   Manufacturer: [employer], [employer]   Lot: [id number]   NDC: [phone]

## 2020-12-21 LAB — CBC WITH DIFFERENTIAL/PLATELET
Basophils Absolute: 0.1 10*3/uL (ref 0.0–0.2)
Basos: 1 %
EOS (ABSOLUTE): 0.4 10*3/uL (ref 0.0–0.4)
Eos: 3 %
Hematocrit: 45.7 % (ref 37.5–51.0)
Hemoglobin: 14.4 g/dL (ref 13.0–17.7)
Immature Grans (Abs): 0.1 10*3/uL (ref 0.0–0.1)
Immature Granulocytes: 1 %
Lymphocytes Absolute: 1.5 10*3/uL (ref 0.7–3.1)
Lymphs: 15 %
MCH: 27.4 pg (ref 26.6–33.0)
MCHC: 31.5 g/dL (ref 31.5–35.7)
MCV: 87 fL (ref 79–97)
Monocytes Absolute: 0.9 10*3/uL (ref 0.1–0.9)
Monocytes: 9 %
Neutrophils Absolute: 7.6 10*3/uL — ABNORMAL HIGH (ref 1.4–7.0)
Neutrophils: 71 %
Platelets: 355 10*3/uL (ref 150–450)
RBC: 5.25 x10E6/uL (ref 4.14–5.80)
RDW: 14 % (ref 11.6–15.4)
WBC: 10.5 10*3/uL (ref 3.4–10.8)

## 2020-12-21 LAB — PSA: Prostate Specific Ag, Serum: 0.2 ng/mL (ref 0.0–4.0)

## 2020-12-21 LAB — COMPREHENSIVE METABOLIC PANEL
ALT: 18 IU/L (ref 0–44)
AST: 18 IU/L (ref 0–40)
Albumin/Globulin Ratio: 1.4 (ref 1.2–2.2)
Albumin: 4.2 g/dL (ref 3.8–4.9)
Alkaline Phosphatase: 82 IU/L (ref 44–121)
BUN/Creatinine Ratio: 16 (ref 9–20)
BUN: 16 mg/dL (ref 6–24)
Bilirubin Total: 0.3 mg/dL (ref 0.0–1.2)
CO2: 27 mmol/L (ref 20–29)
Calcium: 9 mg/dL (ref 8.7–10.2)
Chloride: 99 mmol/L (ref 96–106)
Creatinine, Ser: 1 mg/dL (ref 0.76–1.27)
Globulin, Total: 3 g/dL (ref 1.5–4.5)
Glucose: 120 mg/dL — ABNORMAL HIGH (ref 65–99)
Potassium: 5.1 mmol/L (ref 3.5–5.2)
Sodium: 138 mmol/L (ref 134–144)
Total Protein: 7.2 g/dL (ref 6.0–8.5)
eGFR: 87 mL/min/{1.73_m2} (ref >59)

## 2020-12-21 NOTE — Progress Notes (Signed)
Cbc normal, glucose 120, kidney tests normal, liver tests normal, PSA 0.2 normal,  lp

## 2021-01-18 ENCOUNTER — Telehealth: Payer: Self-pay

## 2021-01-19 ENCOUNTER — Ambulatory Visit: Payer: BC Managed Care – PPO | Admitting: Legal Medicine

## 2021-01-19 ENCOUNTER — Encounter: Payer: Self-pay | Admitting: Legal Medicine

## 2021-01-19 VITALS — BP 160/90 | HR 88 | Temp 97.5°F | Resp 16 | Ht 70.0 in | Wt >= 6400 oz

## 2021-01-19 DIAGNOSIS — Z9189 Other specified personal risk factors, not elsewhere classified: Secondary | ICD-10-CM | POA: Diagnosis not present

## 2021-01-19 NOTE — Progress Notes (Signed)
Established Patient Office Visit  Subjective:  Patient ID: Victor Hamilton, male    DOB: 09-Jun-1961  Age: 60 y.o. MRN: [id number]  CC:  Chief Complaint  Patient presents with   Headache   Fatigue    HPI VICK FILTER presents for sickness.  He had a negative Covid. Patient was weak and aching and felt sick June 28 to 30th.  All symptoms gone.  He needs note for work.  Past Medical History:  Diagnosis Date   Morbid obesity due to excess calories ([place])    Obstructive sleep apnea (adult) (pediatric)     History reviewed. No pertinent surgical history.  Family History  Problem Relation Age of Onset   Transient ischemic attack Mother    Hypertension Father    Heart attack Father    Diabetes Father     Social History   Socioeconomic History   Marital status: Divorced    Spouse name: Not on file   Number of children: 1   Years of education: Not on file   Highest education level: Not on file  Occupational History   Not on file  Tobacco Use   Smoking status: Never   Smokeless tobacco: Never  Vaping Use   Vaping Use: Never used  Substance and Sexual Activity   Alcohol use: Not Currently   Drug use: Not Currently   Sexual activity: Not on file  Other Topics Concern   Not on file  Social History Narrative   Not on file   Social Determinants of Health   Financial Resource Strain: Not on file  Food Insecurity: Not on file  Transportation Needs: Not on file  Physical Activity: Not on file  Stress: Not on file  Social Connections: Not on file  Intimate Partner Violence: Not on file    Outpatient Medications Prior to Visit  Medication Sig Dispense Refill   albuterol (VENTOLIN HFA) 108 (90 Base) MCG/ACT inhaler Inhale 2 puffs into the lungs every 6 (six) hours as needed for wheezing or shortness of breath. 8 g 6   BREO ELLIPTA 100-25 MCG/INH AEPB SMARTSIG:1 Inhalation Via Inhaler Daily     cyanocobalamin 1000 MCG tablet Take by mouth.     furosemide (LASIX) 20 MG  tablet TAKE 1 TABLET BY MOUTH EVERY DAY 90 tablet 2   loratadine (CLARITIN) 10 MG tablet Take by mouth.     Potassium Gluconate 2.5 MEQ TABS Take by mouth.     No facility-administered medications prior to visit.    No Known Allergies  ROS Review of Systems  Constitutional:  Negative for chills, fatigue and fever.  HENT:  Negative for congestion, ear pain and sore throat.   Respiratory:  Negative for cough and shortness of breath.   Cardiovascular:  Negative for chest pain.  Gastrointestinal:  Negative for abdominal pain, constipation, diarrhea, nausea and vomiting.  Endocrine: Negative for polydipsia, polyphagia and polyuria.  Genitourinary:  Negative for dysuria and frequency.  Musculoskeletal:  Negative for arthralgias and myalgias.  Neurological:  Negative for dizziness and headaches.  Psychiatric/Behavioral:  Negative for dysphoric mood.        No dysphoria     Objective:    Physical Exam Vitals reviewed.  Constitutional:      Appearance: He is well-developed. He is obese.  HENT:     Head: Normocephalic.     Mouth/Throat:     Mouth: Mucous membranes are moist.  Eyes:     Extraocular Movements: Extraocular movements intact.  Cardiovascular:  Rate and Rhythm: Normal rate and regular rhythm.     Heart sounds: Normal heart sounds. No murmur heard.   No gallop.  Pulmonary:     Effort: Pulmonary effort is normal. No respiratory distress.     Breath sounds: No wheezing.  Abdominal:     General: There is no distension.     Palpations: Abdomen is soft.     Tenderness: There is no abdominal tenderness.  Musculoskeletal:        General: Normal range of motion.     Cervical back: Neck supple.  Skin:    General: Skin is warm.  Neurological:     Mental Status: He is alert.    BP (!) 160/90   Pulse 88   Temp (!) 97.5 F (36.4 C)   Resp 16   Ht '5\' 10"'  (1.778 m)   Wt (!) 411 lb (186.4 kg)   SpO2 97%   BMI 58.97 kg/m  Wt Readings from Last 3 Encounters:   01/19/21 (!) 411 lb (186.4 kg)  12/20/20 (!) 412 lb (186.9 kg)  05/25/20 (!) 404 lb 3.2 oz (183.3 kg)     Health Maintenance Due  Topic Date Due   HIV Screening  Never done   Hepatitis C Screening  Never done   TETANUS/TDAP  Never done   Zoster Vaccines- Shingrix (1 of 2) Never done    There are no preventive care reminders to display for this patient.  No results found for: TSH Lab Results  Component Value Date   WBC 10.5 12/20/2020   HGB 14.4 12/20/2020   HCT 45.7 12/20/2020   MCV 87 12/20/2020   PLT 355 12/20/2020   Lab Results  Component Value Date   NA 138 12/20/2020   K 5.1 12/20/2020   CO2 27 12/20/2020   GLUCOSE 120 (H) 12/20/2020   BUN 16 12/20/2020   CREATININE 1.00 12/20/2020   BILITOT 0.3 12/20/2020   ALKPHOS 82 12/20/2020   AST 18 12/20/2020   ALT 18 12/20/2020   PROT 7.2 12/20/2020   ALBUMIN 4.2 12/20/2020   CALCIUM 9.0 12/20/2020   EGFR 87 12/20/2020   Lab Results  Component Value Date   CHOL 137 05/25/2020   Lab Results  Component Value Date   HDL 34 (L) 05/25/2020   Lab Results  Component Value Date   LDLCALC 88 05/25/2020   Lab Results  Component Value Date   TRIG 75 05/25/2020   Lab Results  Component Value Date   CHOLHDL 4.0 05/25/2020   Lab Results  Component Value Date   HGBA1C 6.2 (H) 05/25/2020      Assessment & Plan:   Diagnoses and all orders for this visit: At increased risk of exposure to COVID-19 virus -     POC COVID-19  Patient having no symptoms now and rapid covid is negative, note for work.     Follow-up: Return in about 6 months (around 07/22/2021).    Reinaldo Meeker, MD

## 2021-01-22 LAB — POC COVID19 BINAXNOW: SARS Coronavirus 2 Ag: NEGATIVE

## 2021-02-01 NOTE — Telephone Encounter (Signed)
Phone encounter opened in error

## 2021-05-06 DIAGNOSIS — M1711 Unilateral primary osteoarthritis, right knee: Secondary | ICD-10-CM | POA: Diagnosis not present

## 2021-06-21 ENCOUNTER — Encounter: Payer: Self-pay | Admitting: Legal Medicine

## 2021-06-21 ENCOUNTER — Other Ambulatory Visit: Payer: Self-pay

## 2021-06-21 ENCOUNTER — Ambulatory Visit: Payer: BC Managed Care – PPO | Admitting: Legal Medicine

## 2021-06-21 ENCOUNTER — Ambulatory Visit (INDEPENDENT_AMBULATORY_CARE_PROVIDER_SITE_OTHER): Payer: BC Managed Care – PPO

## 2021-06-21 DIAGNOSIS — Z6841 Body Mass Index (BMI) 40.0 and over, adult: Secondary | ICD-10-CM

## 2021-06-21 DIAGNOSIS — G4733 Obstructive sleep apnea (adult) (pediatric): Secondary | ICD-10-CM

## 2021-06-21 DIAGNOSIS — L97929 Non-pressure chronic ulcer of unspecified part of left lower leg with unspecified severity: Secondary | ICD-10-CM | POA: Diagnosis not present

## 2021-06-21 DIAGNOSIS — R7303 Prediabetes: Secondary | ICD-10-CM

## 2021-06-21 DIAGNOSIS — I83029 Varicose veins of left lower extremity with ulcer of unspecified site: Secondary | ICD-10-CM | POA: Diagnosis not present

## 2021-06-21 DIAGNOSIS — J4520 Mild intermittent asthma, uncomplicated: Secondary | ICD-10-CM

## 2021-06-21 DIAGNOSIS — Z23 Encounter for immunization: Secondary | ICD-10-CM

## 2021-06-21 LAB — HEMOGLOBIN A1C
Est. average glucose Bld gHb Est-mCnc: 137 mg/dL
Hgb A1c MFr Bld: 6.4 % — ABNORMAL HIGH (ref 4.8–5.6)

## 2021-06-21 LAB — COMPREHENSIVE METABOLIC PANEL
ALT: 31 IU/L (ref 0–44)
AST: 25 IU/L (ref 0–40)
Albumin/Globulin Ratio: 1.5 (ref 1.2–2.2)
Albumin: 4.1 g/dL (ref 3.8–4.9)
Alkaline Phosphatase: 91 IU/L (ref 44–121)
BUN/Creatinine Ratio: 12 (ref 9–20)
BUN: 11 mg/dL (ref 6–24)
Bilirubin Total: 0.3 mg/dL (ref 0.0–1.2)
CO2: 27 mmol/L (ref 20–29)
Calcium: 9 mg/dL (ref 8.7–10.2)
Chloride: 101 mmol/L (ref 96–106)
Creatinine, Ser: 0.9 mg/dL (ref 0.76–1.27)
Globulin, Total: 2.7 g/dL (ref 1.5–4.5)
Glucose: 120 mg/dL — ABNORMAL HIGH (ref 70–99)
Potassium: 4.4 mmol/L (ref 3.5–5.2)
Sodium: 142 mmol/L (ref 134–144)
Total Protein: 6.8 g/dL (ref 6.0–8.5)
eGFR: 98 mL/min/{1.73_m2} (ref >59)

## 2021-06-21 LAB — CBC WITH DIFFERENTIAL/PLATELET
Basophils Absolute: 0.1 10*3/uL (ref 0.0–0.2)
Basos: 1 %
EOS (ABSOLUTE): 0.3 10*3/uL (ref 0.0–0.4)
Eos: 3 %
Hematocrit: 42.3 % (ref 37.5–51.0)
Hemoglobin: 13.4 g/dL (ref 13.0–17.7)
Immature Grans (Abs): 0 10*3/uL (ref 0.0–0.1)
Immature Granulocytes: 0 %
Lymphocytes Absolute: 1.4 10*3/uL (ref 0.7–3.1)
Lymphs: 15 %
MCH: 26.6 pg (ref 26.6–33.0)
MCHC: 31.7 g/dL (ref 31.5–35.7)
MCV: 84 fL (ref 79–97)
Monocytes Absolute: 0.8 10*3/uL (ref 0.1–0.9)
Monocytes: 8 %
Neutrophils Absolute: 7.1 10*3/uL — ABNORMAL HIGH (ref 1.4–7.0)
Neutrophils: 73 %
Platelets: 312 10*3/uL (ref 150–450)
RBC: 5.04 x10E6/uL (ref 4.14–5.80)
RDW: 13.8 % (ref 11.6–15.4)
WBC: 9.7 10*3/uL (ref 3.4–10.8)

## 2021-06-21 NOTE — Progress Notes (Signed)
Established Patient Office Visit  Subjective:  Patient ID: Victor Hamilton, male    DOB: 1960-07-26  Age: 60 y.o. MRN: [id number]  CC:  Chief Complaint  Patient presents with   Follow-up    63M    HPI [name] presents for chronic  visit  Using CPAP consistently every night and medically benefiting from its use.   Patient has moderate intermittant asthma uncomplicated.  Asthma was diagnosed adult and has occasional daytime episodes and occasional night symptoms.  Patient is not having attack and is using breo, albuterol.  Patient does not smoke.  No symptoms   Past Medical History:  Diagnosis Date   Morbid obesity due to excess calories (HCC)    Obstructive sleep apnea (adult) (pediatric)     History reviewed. No pertinent surgical history.  Family History  Problem Relation Age of Onset   Transient ischemic attack Mother    Hypertension Father    Heart attack Father    Diabetes Father     Social History   Socioeconomic History   Marital status: Divorced    Spouse name: Not on file   Number of children: 1   Years of education: Not on file   Highest education level: Not on file  Occupational History   Not on file  Tobacco Use   Smoking status: Never   Smokeless tobacco: Never  Vaping Use   Vaping Use: Never used  Substance and Sexual Activity   Alcohol use: Not Currently   Drug use: Not Currently   Sexual activity: Not on file  Other Topics Concern   Not on file  Social History Narrative   Not on file   Social Determinants of Health   Financial Resource Strain: Not on file  Food Insecurity: Not on file  Transportation Needs: Not on file  Physical Activity: Not on file  Stress: Not on file  Social Connections: Not on file  Intimate Partner Violence: Not on file    Outpatient Medications Prior to Visit  Medication Sig Dispense Refill   albuterol (VENTOLIN HFA) 108 (90 Base) MCG/ACT inhaler Inhale 2 puffs into the lungs every 6 (six) hours as  needed for wheezing or shortness of breath. 8 g 6   BREO ELLIPTA 100-25 MCG/INH AEPB SMARTSIG:1 Inhalation Via Inhaler Daily     cyanocobalamin 1000 MCG tablet Take by mouth.     furosemide (LASIX) 20 MG tablet TAKE 1 TABLET BY MOUTH EVERY DAY 90 tablet 2   loratadine (CLARITIN) 10 MG tablet Take by mouth.     Potassium Gluconate 2.5 MEQ TABS Take by mouth.     No facility-administered medications prior to visit.    No Known Allergies  ROS Review of Systems  Constitutional:  Negative for activity change and appetite change.  HENT:  Negative for congestion and dental problem.   Eyes:  Negative for visual disturbance.  Respiratory:  Negative for chest tightness and shortness of breath.   Cardiovascular:  Negative for chest pain, palpitations and leg swelling.  Gastrointestinal:  Negative for abdominal distention and abdominal pain.  Endocrine: Negative for polyuria.  Genitourinary:  Negative for difficulty urinating and enuresis.  Musculoskeletal:  Negative for arthralgias and back pain.  Neurological: Negative.   Psychiatric/Behavioral: Negative.       Objective:    Physical Exam Vitals reviewed.  Constitutional:      General: He is not in acute distress.    Appearance: Normal appearance. He is obese.  HENT:  Right Ear: Tympanic membrane normal.     Left Ear: Tympanic membrane normal.     Mouth/Throat:     Mouth: Mucous membranes are moist.     Pharynx: Oropharynx is clear.  Eyes:     Extraocular Movements: Extraocular movements intact.     Conjunctiva/sclera: Conjunctivae normal.     Pupils: Pupils are equal, round, and reactive to light.  Cardiovascular:     Rate and Rhythm: Normal rate and regular rhythm.     Pulses: Normal pulses.     Heart sounds: Normal heart sounds. No murmur heard.   No gallop.  Pulmonary:     Effort: Pulmonary effort is normal. No respiratory distress.     Breath sounds: Normal breath sounds. No wheezing.  Abdominal:     General:  Abdomen is flat. Bowel sounds are normal. There is no distension.     Tenderness: There is no abdominal tenderness.  Musculoskeletal:        General: Normal range of motion.     Cervical back: Normal range of motion and neck supple.     Right lower leg: No edema.     Left lower leg: No edema.  Skin:    General: Skin is warm.     Capillary Refill: Capillary refill takes less than 2 seconds.     Findings: Lesion present.     Comments: #4 1 to 2cm stage 2 ulcers left lower leg.  He has edema but no infection  Neurological:     General: No focal deficit present.     Mental Status: He is alert and oriented to person, place, and time. Mental status is at baseline.    BP 130/90 (BP Location: Left Arm, Patient Position: Sitting, Cuff Size: Large)   Pulse 100   Temp (!) 97 F (36.1 C) (Temporal)   Ht '5\' 10"'  (1.778 m)   Wt (!) 418 lb (189.6 kg)   SpO2 94%   BMI 59.98 kg/m  Wt Readings from Last 3 Encounters:  06/21/21 (!) 418 lb (189.6 kg)  01/19/21 (!) 411 lb (186.4 kg)  12/20/20 (!) 412 lb (186.9 kg)     Health Maintenance Due  Topic Date Due   Pneumococcal Vaccine 50-87 Years old (1 - PCV) Never done   HIV Screening  Never done   Hepatitis C Screening  Never done   TETANUS/TDAP  Never done   Zoster Vaccines- Shingrix (1 of 2) Never done   INFLUENZA VACCINE  02/19/2021    There are no preventive care reminders to display for this patient.  No results found for: TSH Lab Results  Component Value Date   WBC 10.5 12/20/2020   HGB 14.4 12/20/2020   HCT 45.7 12/20/2020   MCV 87 12/20/2020   PLT 355 12/20/2020   Lab Results  Component Value Date   NA 138 12/20/2020   K 5.1 12/20/2020   CO2 27 12/20/2020   GLUCOSE 120 (H) 12/20/2020   BUN 16 12/20/2020   CREATININE 1.00 12/20/2020   BILITOT 0.3 12/20/2020   ALKPHOS 82 12/20/2020   AST 18 12/20/2020   ALT 18 12/20/2020   PROT 7.2 12/20/2020   ALBUMIN 4.2 12/20/2020   CALCIUM 9.0 12/20/2020   EGFR 87 12/20/2020    Lab Results  Component Value Date   CHOL 137 05/25/2020   Lab Results  Component Value Date   HDL 34 (L) 05/25/2020   Lab Results  Component Value Date   LDLCALC 88 05/25/2020   Lab  Results  Component Value Date   TRIG 75 05/25/2020   Lab Results  Component Value Date   CHOLHDL 4.0 05/25/2020   Lab Results  Component Value Date   HGBA1C 6.2 (H) 05/25/2020      Assessment & Plan:   Problem List Items Addressed This Visit       Cardiovascular and Mediastinum   Ulcers left leg Patient has 4 1 to 2 cm stage 2 ulcers on left leg that is edematous.  No redness, dressed with unna boots to change twice a week     Respiratory   Obstructive sleep apnea (adult) (pediatric)   Relevant Orders   CBC with Differential/Platelet   Comprehensive metabolic panel Using CPAP consistently every night and medically benefiting from its use.    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  Patient is not having a flair.  Chronic medicines include as above. Addition new medicines none.  Asthma action plan is in place.       Other   Morbid obesity ([place]) - Primary   Relevant Orders   Comprehensive metabolic panel An individualize plan was formulated for obesity using patient history and physical exam to encourage weight loss.  An evidence based program was formulated.  Patient is to cut portion size with meals and to plan physical exercise 3 days a week at least 20 minutes.  Weight watchers and other programs are helpful.  Planned amount of weight loss 10 lbs.    BMI 50.0-59.9, adult [employer]) An individualize plan was formulated for obesity using patient history and physical exam to encourage weight loss.  An evidence based program was formulated.  Patient is to cut portion size with meals and to plan physical exercise 3 days a week at least 20 minutes.  Weight watchers and other programs are helpful.  Planned amount of weight loss 10  lbs.    Prediabetes   Relevant Orders   CBC with Differential/Platelet   Comprehensive metabolic panel   Hemoglobin A1c Patient is on diet, check A1c       Follow-up: Return in about 3 days (around 06/24/2021) for nurse, rewrap.    Reinaldo Meeker, MD

## 2021-06-22 NOTE — Progress Notes (Signed)
CBC normal, glucose 120, kidney and liver tests normal, A1c 6.4,  lp

## 2021-06-25 DIAGNOSIS — R5383 Other fatigue: Secondary | ICD-10-CM | POA: Diagnosis not present

## 2021-06-25 DIAGNOSIS — J4530 Mild persistent asthma, uncomplicated: Secondary | ICD-10-CM | POA: Diagnosis not present

## 2021-06-25 DIAGNOSIS — E662 Morbid (severe) obesity with alveolar hypoventilation: Secondary | ICD-10-CM | POA: Diagnosis not present

## 2021-06-25 DIAGNOSIS — G4733 Obstructive sleep apnea (adult) (pediatric): Secondary | ICD-10-CM | POA: Diagnosis not present

## 2021-06-26 ENCOUNTER — Other Ambulatory Visit: Payer: Self-pay

## 2021-06-26 ENCOUNTER — Ambulatory Visit (INDEPENDENT_AMBULATORY_CARE_PROVIDER_SITE_OTHER): Payer: BC Managed Care – PPO

## 2021-06-26 DIAGNOSIS — L97929 Non-pressure chronic ulcer of unspecified part of left lower leg with unspecified severity: Secondary | ICD-10-CM | POA: Diagnosis not present

## 2021-06-26 DIAGNOSIS — I83029 Varicose veins of left lower extremity with ulcer of unspecified site: Secondary | ICD-10-CM | POA: Diagnosis not present

## 2021-06-26 NOTE — Progress Notes (Signed)
His lower leg has edema with 4 little ulcers. Patient is here to rewrap his left leg with Unna boot per Dr. Marina Goodell. He ordered a culture on his left lower leg.

## 2021-07-02 ENCOUNTER — Ambulatory Visit (INDEPENDENT_AMBULATORY_CARE_PROVIDER_SITE_OTHER): Payer: BC Managed Care – PPO | Admitting: Legal Medicine

## 2021-07-02 ENCOUNTER — Other Ambulatory Visit: Payer: Self-pay

## 2021-07-02 ENCOUNTER — Encounter: Payer: Self-pay | Admitting: Legal Medicine

## 2021-07-02 VITALS — BP 148/82 | HR 90 | Temp 97.3°F | Ht 70.0 in | Wt >= 6400 oz

## 2021-07-02 DIAGNOSIS — L97929 Non-pressure chronic ulcer of unspecified part of left lower leg with unspecified severity: Secondary | ICD-10-CM | POA: Diagnosis not present

## 2021-07-02 DIAGNOSIS — I83029 Varicose veins of left lower extremity with ulcer of unspecified site: Secondary | ICD-10-CM

## 2021-07-02 MED ORDER — AMOXICILLIN 875 MG PO TABS: 875.0000 mg | ORAL_TABLET | Freq: Two times a day (BID) | ORAL | 0 refills | Status: AC

## 2021-07-02 NOTE — Progress Notes (Signed)
Acute Office Visit  Subjective:    Patient ID: Victor Hamilton, male    DOB: [birth year], 60 y.o.   MRN: [id number]  Chief Complaint  Patient presents with   Left leg ulcer    HPI Patient is in today for leg ulcers healing well left leg.  Much less deep, no infection.  Past Medical History:  Diagnosis Date   Morbid obesity due to excess calories ([place])    Obstructive sleep apnea (adult) (pediatric)     History reviewed. No pertinent surgical history.  Family History  Problem Relation Age of Onset   Transient ischemic attack Mother    Hypertension Father    Heart attack Father    Diabetes Father     Social History   Socioeconomic History   Marital status: Divorced    Spouse name: Not on file   Number of children: 1   Years of education: Not on file   Highest education level: Not on file  Occupational History   Not on file  Tobacco Use   Smoking status: Never   Smokeless tobacco: Never  Vaping Use   Vaping Use: Never used  Substance and Sexual Activity   Alcohol use: Not Currently   Drug use: Not Currently   Sexual activity: Not on file  Other Topics Concern   Not on file  Social History Narrative   Not on file   Social Determinants of Health   Financial Resource Strain: Not on file  Food Insecurity: Not on file  Transportation Needs: Not on file  Physical Activity: Not on file  Stress: Not on file  Social Connections: Not on file  Intimate Partner Violence: Not on file    Outpatient Medications Prior to Visit  Medication Sig Dispense Refill   albuterol (VENTOLIN HFA) 108 (90 Base) MCG/ACT inhaler Inhale 2 puffs into the lungs every 6 (six) hours as needed for wheezing or shortness of breath. 8 g 6   BREO ELLIPTA 100-25 MCG/INH AEPB SMARTSIG:1 Inhalation Via Inhaler Daily     cyanocobalamin 1000 MCG tablet Take by mouth.     furosemide (LASIX) 20 MG tablet TAKE 1 TABLET BY MOUTH EVERY DAY 90 tablet 2   loratadine (CLARITIN) 10 MG tablet Take by  mouth.     Potassium Gluconate 2.5 MEQ TABS Take by mouth.     No facility-administered medications prior to visit.    No Known Allergies  Review of Systems  Constitutional:  Negative for appetite change, fatigue and fever.  HENT:  Negative for congestion, ear pain and sore throat.   Respiratory:  Negative for cough and shortness of breath.   Cardiovascular:  Negative for chest pain and leg swelling.  Gastrointestinal:  Negative for abdominal pain, constipation, diarrhea, nausea and vomiting.  Genitourinary:  Negative for dysuria and frequency.  Musculoskeletal:  Negative for arthralgias and myalgias.  Skin:  Positive for wound (Left leg ulcer).  Neurological:  Negative for dizziness and headaches.  Psychiatric/Behavioral:  Negative for dysphoric mood. The patient is not nervous/anxious.       Objective:    Physical Exam Vitals reviewed.  Constitutional:      Appearance: Normal appearance. He is normal weight.  HENT:     Head: Normocephalic.     Right Ear: Tympanic membrane normal.     Left Ear: Tympanic membrane normal.     Nose: Nose normal.     Mouth/Throat:     Mouth: Mucous membranes are moist.     Pharynx:  Oropharynx is clear.  Eyes:     Extraocular Movements: Extraocular movements intact.     Conjunctiva/sclera: Conjunctivae normal.     Pupils: Pupils are equal, round, and reactive to light.  Cardiovascular:     Rate and Rhythm: Normal rate and regular rhythm.     Pulses: Normal pulses.     Heart sounds: Normal heart sounds. No murmur heard.   No gallop.  Pulmonary:     Effort: Pulmonary effort is normal. No respiratory distress.     Breath sounds: Normal breath sounds. No wheezing.  Abdominal:     General: Abdomen is flat. Bowel sounds are normal. There is no distension.     Palpations: Abdomen is soft.     Tenderness: There is no abdominal tenderness.  Musculoskeletal:     Cervical back: Normal range of motion and neck supple.  Skin:    General: Skin is  warm and dry.     Capillary Refill: Capillary refill takes less than 2 seconds.     Comments: Healing wound left leg.  Culture positive for beta hemolytic strep.  Ulcer 1.5 to 0.5 cm and less deep.  Neurological:     General: No focal deficit present.     Mental Status: He is alert and oriented to person, place, and time.  Psychiatric:        Mood and Affect: Mood normal.        Behavior: Behavior normal.    BP (!) 148/82 (BP Location: Left Arm, Patient Position: Sitting)   Pulse 90   Temp (!) 97.3 F (36.3 C) (Temporal)   Ht '5\' 10"'  (1.778 m)   Wt (!) 413 lb (187.3 kg)   SpO2 94%   BMI 59.26 kg/m  Wt Readings from Last 3 Encounters:  07/02/21 (!) 413 lb (187.3 kg)  06/21/21 (!) 418 lb (189.6 kg)  01/19/21 (!) 411 lb (186.4 kg)    Health Maintenance Due  Topic Date Due   Pneumococcal Vaccine 44-85 Years old (1 - PCV) Never done   HIV Screening  Never done   Hepatitis C Screening  Never done   TETANUS/TDAP  Never done   Zoster Vaccines- Shingrix (1 of 2) Never done   INFLUENZA VACCINE  02/19/2021    There are no preventive care reminders to display for this patient.   No results found for: TSH Lab Results  Component Value Date   WBC 9.7 06/21/2021   HGB 13.4 06/21/2021   HCT 42.3 06/21/2021   MCV 84 06/21/2021   PLT 312 06/21/2021   Lab Results  Component Value Date   NA 142 06/21/2021   K 4.4 06/21/2021   CO2 27 06/21/2021   GLUCOSE 120 (H) 06/21/2021   BUN 11 06/21/2021   CREATININE 0.90 06/21/2021   BILITOT 0.3 06/21/2021   ALKPHOS 91 06/21/2021   AST 25 06/21/2021   ALT 31 06/21/2021   PROT 6.8 06/21/2021   ALBUMIN 4.1 06/21/2021   CALCIUM 9.0 06/21/2021   EGFR 98 06/21/2021   Lab Results  Component Value Date   CHOL 137 05/25/2020   Lab Results  Component Value Date   HDL 34 (L) 05/25/2020   Lab Results  Component Value Date   LDLCALC 88 05/25/2020   Lab Results  Component Value Date   TRIG 75 05/25/2020   Lab Results  Component  Value Date   CHOLHDL 4.0 05/25/2020   Lab Results  Component Value Date   HGBA1C 6.4 (H) 06/21/2021  Assessment & Plan:   1. Ulcerated leg varices, left (HCC) Patient has healing of leg ulcerations, Beta strep in culture, start amoxicillin.  Follow up one week  Meds ordered this encounter  Medications   amoxicillin (AMOXIL) 875 MG tablet    Sig: Take 1 tablet (875 mg total) by mouth 2 (two) times daily for 10 days.    Dispense:  20 tablet    Refill:  0     I,Lauren M Auman,acting as a scribe for Reinaldo Meeker, MD.,have documented all relevant documentation on the behalf of Reinaldo Meeker, MD,as directed by  Reinaldo Meeker, MD while in the presence of Reinaldo Meeker, MD.   Reinaldo Meeker, MD

## 2021-07-04 ENCOUNTER — Other Ambulatory Visit: Payer: Self-pay | Admitting: Legal Medicine

## 2021-07-04 LAB — ANAEROBIC AND AEROBIC CULTURE

## 2021-07-10 ENCOUNTER — Other Ambulatory Visit: Payer: Self-pay

## 2021-07-10 ENCOUNTER — Ambulatory Visit: Payer: BC Managed Care – PPO | Admitting: Legal Medicine

## 2021-07-10 ENCOUNTER — Encounter: Payer: Self-pay | Admitting: Legal Medicine

## 2021-07-10 DIAGNOSIS — L97929 Non-pressure chronic ulcer of unspecified part of left lower leg with unspecified severity: Secondary | ICD-10-CM

## 2021-07-10 DIAGNOSIS — I83029 Varicose veins of left lower extremity with ulcer of unspecified site: Secondary | ICD-10-CM

## 2021-07-10 NOTE — Progress Notes (Signed)
Acute Office Visit  Subjective:    Patient ID: Victor Hamilton, male    DOB: 03/03/1961, 60 y.o.   MRN: [id number]  Chief Complaint  Patient presents with   Wound Check    HPI: Patient is in today for wounds healing well. They are less deep and smaller.  Past Medical History:  Diagnosis Date   Morbid obesity due to excess calories ([place])    Obstructive sleep apnea (adult) (pediatric)     History reviewed. No pertinent surgical history.  Family History  Problem Relation Age of Onset   Transient ischemic attack Mother    Hypertension Father    Heart attack Father    Diabetes Father     Social History   Socioeconomic History   Marital status: Divorced    Spouse name: Not on file   Number of children: 1   Years of education: Not on file   Highest education level: Not on file  Occupational History   Not on file  Tobacco Use   Smoking status: Never   Smokeless tobacco: Never  Vaping Use   Vaping Use: Never used  Substance and Sexual Activity   Alcohol use: Not Currently   Drug use: Not Currently   Sexual activity: Not on file  Other Topics Concern   Not on file  Social History Narrative   Not on file   Social Determinants of Health   Financial Resource Strain: Not on file  Food Insecurity: Not on file  Transportation Needs: Not on file  Physical Activity: Not on file  Stress: Not on file  Social Connections: Not on file  Intimate Partner Violence: Not on file    Outpatient Medications Prior to Visit  Medication Sig Dispense Refill   albuterol (VENTOLIN HFA) 108 (90 Base) MCG/ACT inhaler Inhale 2 puffs into the lungs every 6 (six) hours as needed for wheezing or shortness of breath. 8 g 6   amoxicillin (AMOXIL) 875 MG tablet Take 1 tablet (875 mg total) by mouth 2 (two) times daily for 10 days. 20 tablet 0   BREO ELLIPTA 100-25 MCG/INH AEPB SMARTSIG:1 Inhalation Via Inhaler Daily     cyanocobalamin 1000 MCG tablet Take by mouth.     furosemide (LASIX) 20  MG tablet TAKE 1 TABLET BY MOUTH EVERY DAY 90 tablet 2   loratadine (CLARITIN) 10 MG tablet Take by mouth.     Potassium Gluconate 2.5 MEQ TABS Take by mouth.     No facility-administered medications prior to visit.    No Known Allergies  Review of Systems  Constitutional:  Negative for chills, fatigue, fever and unexpected weight change.  HENT:  Negative for congestion, ear pain, sinus pain and sore throat.   Respiratory:  Positive for shortness of breath. Negative for cough.   Cardiovascular:  Negative for chest pain and palpitations.  Gastrointestinal:  Negative for abdominal pain, blood in stool, constipation, diarrhea, nausea and vomiting.  Endocrine: Negative for polydipsia.  Genitourinary:  Negative for dysuria.  Musculoskeletal:  Negative for back pain.  Skin:  Negative for rash.  Neurological:  Negative for headaches.      Objective:    Physical Exam Vitals reviewed.  Constitutional:      Appearance: Normal appearance. He is obese.  Cardiovascular:     Rate and Rhythm: Normal rate.     Pulses: Normal pulses.  Pulmonary:     Effort: Pulmonary effort is normal. No respiratory distress.     Breath sounds: Normal breath sounds.  No wheezing.  Musculoskeletal:        General: Normal range of motion.  Skin:    General: Skin is warm.     Capillary Refill: Capillary refill takes less than 2 seconds.     Comments: 4 ulcers, no drainage, 0.8 to 1 cm and superficial  Neurological:     Mental Status: He is alert.    BP 132/80    Pulse 96    Temp 98.3 F (36.8 C)    Resp 16    Ht '5\' 10"'  (1.778 m)    Wt (!) 417 lb (189.1 kg)    SpO2 93%    BMI 59.83 kg/m  Wt Readings from Last 3 Encounters:  07/10/21 (!) 417 lb (189.1 kg)  07/02/21 (!) 413 lb (187.3 kg)  06/21/21 (!) 418 lb (189.6 kg)    Health Maintenance Due  Topic Date Due   Pneumococcal Vaccine 12-74 Years old (1 - PCV) Never done   HIV Screening  Never done   Hepatitis C Screening  Never done   TETANUS/TDAP   Never done   Zoster Vaccines- Shingrix (1 of 2) Never done   INFLUENZA VACCINE  02/19/2021    There are no preventive care reminders to display for this patient.   No results found for: TSH Lab Results  Component Value Date   WBC 9.7 06/21/2021   HGB 13.4 06/21/2021   HCT 42.3 06/21/2021   MCV 84 06/21/2021   PLT 312 06/21/2021   Lab Results  Component Value Date   NA 142 06/21/2021   K 4.4 06/21/2021   CO2 27 06/21/2021   GLUCOSE 120 (H) 06/21/2021   BUN 11 06/21/2021   CREATININE 0.90 06/21/2021   BILITOT 0.3 06/21/2021   ALKPHOS 91 06/21/2021   AST 25 06/21/2021   ALT 31 06/21/2021   PROT 6.8 06/21/2021   ALBUMIN 4.1 06/21/2021   CALCIUM 9.0 06/21/2021   EGFR 98 06/21/2021   Lab Results  Component Value Date   CHOL 137 05/25/2020   Lab Results  Component Value Date   HDL 34 (L) 05/25/2020   Lab Results  Component Value Date   LDLCALC 88 05/25/2020   Lab Results  Component Value Date   TRIG 75 05/25/2020   Lab Results  Component Value Date   CHOLHDL 4.0 05/25/2020   Lab Results  Component Value Date   HGBA1C 6.4 (H) 06/21/2021       Assessment & Plan:   Problem List Items Addressed This Visit       Cardiovascular and Mediastinum   Ulcerated leg varices, left (HCC)  The ulcers are healing well, unna boot re-applied      Follow-up: Return in about 1 week (around 07/17/2021) for ulcers on legs.  An After Visit Summary was printed and given to the patient.  Reinaldo Meeker, MD [facility] [phone]

## 2021-07-11 MED ORDER — SULFAMETHOXAZOLE-TRIMETHOPRIM 800-160 MG PO TABS: 1.0000 | ORAL_TABLET | Freq: Two times a day (BID) | ORAL | 0 refills | Status: DC

## 2021-07-17 ENCOUNTER — Ambulatory Visit: Payer: BC Managed Care – PPO | Admitting: Legal Medicine

## 2021-07-17 ENCOUNTER — Other Ambulatory Visit: Payer: Self-pay

## 2021-07-17 ENCOUNTER — Encounter: Payer: Self-pay | Admitting: Legal Medicine

## 2021-07-17 VITALS — BP 140/80 | HR 97 | Temp 99.7°F | Resp 18 | Ht 70.0 in | Wt >= 6400 oz

## 2021-07-17 DIAGNOSIS — L97929 Non-pressure chronic ulcer of unspecified part of left lower leg with unspecified severity: Secondary | ICD-10-CM

## 2021-07-17 DIAGNOSIS — I83029 Varicose veins of left lower extremity with ulcer of unspecified site: Secondary | ICD-10-CM

## 2021-07-17 NOTE — Progress Notes (Signed)
Subjective:  Patient ID: Victor Hamilton, male    DOB: Nov 04, 1960  Age: 60 y.o. MRN: [id number]  Chief Complaint  Patient presents with   Wound Check    HPI: wound healing well left leg.  No redness and ulcers shallow.   Current Outpatient Medications on File Prior to Visit  Medication Sig Dispense Refill   albuterol (VENTOLIN HFA) 108 (90 Base) MCG/ACT inhaler Inhale 2 puffs into the lungs every 6 (six) hours as needed for wheezing or shortness of breath. 8 g 6   BREO ELLIPTA 100-25 MCG/INH AEPB SMARTSIG:1 Inhalation Via Inhaler Daily     cyanocobalamin 1000 MCG tablet Take by mouth.     furosemide (LASIX) 20 MG tablet TAKE 1 TABLET BY MOUTH EVERY DAY 90 tablet 2   loratadine (CLARITIN) 10 MG tablet Take by mouth.     Potassium Gluconate 2.5 MEQ TABS Take by mouth.     No current facility-administered medications on file prior to visit.   Past Medical History:  Diagnosis Date   Morbid obesity due to excess calories (HCC)    Obstructive sleep apnea (adult) (pediatric)    History reviewed. No pertinent surgical history.  Family History  Problem Relation Age of Onset   Transient ischemic attack Mother    Hypertension Father    Heart attack Father    Diabetes Father    Social History   Socioeconomic History   Marital status: Divorced    Spouse name: Not on file   Number of children: 1   Years of education: Not on file   Highest education level: Not on file  Occupational History   Not on file  Tobacco Use   Smoking status: Never   Smokeless tobacco: Never  Vaping Use   Vaping Use: Never used  Substance and Sexual Activity   Alcohol use: Not Currently   Drug use: Not Currently   Sexual activity: Not on file  Other Topics Concern   Not on file  Social History Narrative   Not on file   Social Determinants of Health   Financial Resource Strain: Not on file  Food Insecurity: Not on file  Transportation Needs: Not on file  Physical Activity: Not on file  Stress:  Not on file  Social Connections: Not on file    Review of Systems  Constitutional:  Negative for chills, fatigue, fever and unexpected weight change.  HENT:  Negative for congestion, ear pain, sinus pain and sore throat.   Respiratory:  Positive for shortness of breath. Negative for cough.   Cardiovascular:  Negative for chest pain and palpitations.  Gastrointestinal:  Negative for abdominal pain, blood in stool, constipation, diarrhea, nausea and vomiting.  Endocrine: Negative for polydipsia.  Genitourinary:  Negative for dysuria.  Musculoskeletal:  Negative for back pain.  Skin:  Negative for rash.  Neurological:  Negative for headaches.    Objective:  BP 140/80    Pulse 97    Temp 99.7 F (37.6 C)    Resp 18    Ht 5\' 10"  (1.778 m)    Wt (!) 414 lb (187.8 kg)    SpO2 95%    BMI 59.40 kg/m   BP/Weight 07/17/2021 07/10/2021 07/02/2021  Systolic BP 140 132 148  Diastolic BP 80 80 82  Wt. (Lbs) 414 417 413  BMI 59.4 59.83 59.26    Physical Exam Vitals reviewed.  Constitutional:      Appearance: Normal appearance.  HENT:     Right Ear: Tympanic  membrane normal.     Left Ear: Tympanic membrane normal.  Eyes:     Extraocular Movements: Extraocular movements intact.     Conjunctiva/sclera: Conjunctivae normal.     Pupils: Pupils are equal, round, and reactive to light.  Cardiovascular:     Rate and Rhythm: Normal rate and regular rhythm.     Pulses: Normal pulses.     Heart sounds: Normal heart sounds. No murmur heard.   No gallop.  Pulmonary:     Effort: Pulmonary effort is normal. No respiratory distress.     Breath sounds: Normal breath sounds. No wheezing.  Abdominal:     General: Abdomen is flat. Bowel sounds are normal. There is no distension.     Tenderness: There is no abdominal tenderness.  Musculoskeletal:        General: Normal range of motion.     Cervical back: Normal range of motion and neck supple.  Skin:    Capillary Refill: Capillary refill takes less  than 2 seconds.     Comments: Ulcers healing well left leg.  Anterior ulcer 1cm, small ulcer 0.84mm posteriorly  Neurological:     General: No focal deficit present.     Mental Status: He is alert and oriented to person, place, and time. Mental status is at baseline.        Lab Results  Component Value Date   WBC 9.7 06/21/2021   HGB 13.4 06/21/2021   HCT 42.3 06/21/2021   PLT 312 06/21/2021   GLUCOSE 120 (H) 06/21/2021   CHOL 137 05/25/2020   TRIG 75 05/25/2020   HDL 34 (L) 05/25/2020   LDLCALC 88 05/25/2020   ALT 31 06/21/2021   AST 25 06/21/2021   NA 142 06/21/2021   K 4.4 06/21/2021   CL 101 06/21/2021   CREATININE 0.90 06/21/2021   BUN 11 06/21/2021   CO2 27 06/21/2021   HGBA1C 6.4 (H) 06/21/2021      Assessment & Plan:   Diagnoses and all orders for this visit: Ulcerated leg varices, left (HCC) Left leg healing well, re-apply unna boot  .       Follow-up: Return in about 1 week (around 07/24/2021) for leg ulcer.  An After Visit Summary was printed and given to the patient.  Brent Bulla, MD [facility] [phone]

## 2021-07-25 ENCOUNTER — Encounter: Payer: Self-pay | Admitting: Legal Medicine

## 2021-07-25 ENCOUNTER — Ambulatory Visit (INDEPENDENT_AMBULATORY_CARE_PROVIDER_SITE_OTHER): Payer: BLUE CROSS/BLUE SHIELD | Admitting: Legal Medicine

## 2021-07-25 ENCOUNTER — Other Ambulatory Visit: Payer: Self-pay

## 2021-07-25 VITALS — BP 130/80 | HR 94 | Temp 98.7°F | Resp 16 | Ht 70.0 in | Wt >= 6400 oz

## 2021-07-25 DIAGNOSIS — I83029 Varicose veins of left lower extremity with ulcer of unspecified site: Secondary | ICD-10-CM

## 2021-07-25 DIAGNOSIS — L97929 Non-pressure chronic ulcer of unspecified part of left lower leg with unspecified severity: Secondary | ICD-10-CM | POA: Diagnosis not present

## 2021-07-25 NOTE — Progress Notes (Signed)
Subjective:  Patient ID: Victor Hamilton, male    DOB: June 19, 1961  Age: 61 y.o. MRN: [id number]  Chief Complaint  Patient presents with   Leg Swelling    HPI: chronic venous incompetence with healing ulcer.  No redness and ulcer has healed to ne CM.  The satellite ulcers are healed.   Patient is here to recheck the ulcer on his left lower leg.  Current Outpatient Medications on File Prior to Visit  Medication Sig Dispense Refill   albuterol (VENTOLIN HFA) 108 (90 Base) MCG/ACT inhaler Inhale 2 puffs into the lungs every 6 (six) hours as needed for wheezing or shortness of breath. 8 g 6   BREO ELLIPTA 100-25 MCG/INH AEPB SMARTSIG:1 Inhalation Via Inhaler Daily     cyanocobalamin 1000 MCG tablet Take by mouth.     furosemide (LASIX) 20 MG tablet TAKE 1 TABLET BY MOUTH EVERY DAY 90 tablet 2   loratadine (CLARITIN) 10 MG tablet Take by mouth.     Potassium Gluconate 2.5 MEQ TABS Take by mouth.     No current facility-administered medications on file prior to visit.   Past Medical History:  Diagnosis Date   Morbid obesity due to excess calories (HCC)    Obstructive sleep apnea (adult) (pediatric)    No past surgical history on file.  Family History  Problem Relation Age of Onset   Transient ischemic attack Mother    Hypertension Father    Heart attack Father    Diabetes Father    Social History   Socioeconomic History   Marital status: Divorced    Spouse name: Not on file   Number of children: 1   Years of education: Not on file   Highest education level: Not on file  Occupational History   Not on file  Tobacco Use   Smoking status: Never   Smokeless tobacco: Never  Vaping Use   Vaping Use: Never used  Substance and Sexual Activity   Alcohol use: Not Currently   Drug use: Not Currently   Sexual activity: Not on file  Other Topics Concern   Not on file  Social History Narrative   Not on file   Social Determinants of Health   Financial Resource Strain: Not on  file  Food Insecurity: Not on file  Transportation Needs: Not on file  Physical Activity: Not on file  Stress: Not on file  Social Connections: Not on file    Review of Systems  Constitutional:  Negative for chills, fatigue, fever and unexpected weight change.  HENT:  Negative for congestion, ear pain, sinus pain and sore throat.   Respiratory:  Negative for cough and shortness of breath.   Cardiovascular:  Negative for chest pain, palpitations and leg swelling.  Gastrointestinal:  Negative for abdominal pain, blood in stool, constipation, diarrhea, nausea and vomiting.  Endocrine: Negative for polydipsia.  Genitourinary:  Negative for dysuria.  Musculoskeletal:  Negative for back pain.  Skin:  Negative for rash.  Neurological:  Negative for headaches.    Objective:  BP 130/80    Pulse 94    Temp 98.7 F (37.1 C)    Resp 16    Ht 5\' 10"  (1.778 m)    Wt (!) 415 lb (188.2 kg)    SpO2 98%    BMI 59.55 kg/m   BP/Weight 07/25/2021 07/17/2021 07/10/2021  Systolic BP 130 140 132  Diastolic BP 80 80 80  Wt. (Lbs) 415 414 417  BMI 59.55 59.4 59.83  Physical Exam Vitals reviewed.  Constitutional:      General: He is not in acute distress.    Appearance: Normal appearance. He is obese.  HENT:     Right Ear: Tympanic membrane normal.     Left Ear: Tympanic membrane normal.  Eyes:     Extraocular Movements: Extraocular movements intact.     Conjunctiva/sclera: Conjunctivae normal.     Pupils: Pupils are equal, round, and reactive to light.  Cardiovascular:     Rate and Rhythm: Normal rate and regular rhythm.     Pulses: Normal pulses.     Heart sounds: Normal heart sounds. No murmur heard.   No gallop.  Pulmonary:     Effort: Pulmonary effort is normal. No respiratory distress.     Breath sounds: No wheezing.  Abdominal:     General: Abdomen is flat. Bowel sounds are normal. There is no distension.     Tenderness: There is no abdominal tenderness.  Musculoskeletal:      Cervical back: Normal range of motion.  Skin:    Capillary Refill: Capillary refill takes less than 2 seconds.     Comments: Healing ulcers, left leg.  No infection, small ulcers all healed  Neurological:     General: No focal deficit present.     Mental Status: He is alert and oriented to person, place, and time.        Lab Results  Component Value Date   WBC 9.7 06/21/2021   HGB 13.4 06/21/2021   HCT 42.3 06/21/2021   PLT 312 06/21/2021   GLUCOSE 120 (H) 06/21/2021   CHOL 137 05/25/2020   TRIG 75 05/25/2020   HDL 34 (L) 05/25/2020   LDLCALC 88 05/25/2020   ALT 31 06/21/2021   AST 25 06/21/2021   NA 142 06/21/2021   K 4.4 06/21/2021   CL 101 06/21/2021   CREATININE 0.90 06/21/2021   BUN 11 06/21/2021   CO2 27 06/21/2021   HGBA1C 6.4 (H) 06/21/2021      Assessment & Plan:   Diagnoses and all orders for this visit: Ulcerated leg varices, left (HCC)  . Re-apply unna boot, follow up one week      Follow-up: Return in about 1 week (around 08/01/2021) for leg ulcer.  An After Visit Summary was printed and given to the patient.  Brent Bulla, MD [facility] [phone]

## 2021-07-30 ENCOUNTER — Ambulatory Visit: Payer: BLUE CROSS/BLUE SHIELD

## 2021-07-30 ENCOUNTER — Other Ambulatory Visit: Payer: Self-pay

## 2021-07-30 DIAGNOSIS — I83029 Varicose veins of left lower extremity with ulcer of unspecified site: Secondary | ICD-10-CM

## 2021-07-30 NOTE — Progress Notes (Signed)
Unna boot removed from left lower leg. Open wound cleaned with sterile water, per Dr. Sedalia Muta placed antibiotic ointment on wound, covered with nonstick telfa pad and secured in place with coban.

## 2021-08-06 DIAGNOSIS — Z87891 Personal history of nicotine dependence: Secondary | ICD-10-CM | POA: Diagnosis not present

## 2021-08-06 DIAGNOSIS — J4530 Mild persistent asthma, uncomplicated: Secondary | ICD-10-CM | POA: Diagnosis not present

## 2021-08-06 DIAGNOSIS — E662 Morbid (severe) obesity with alveolar hypoventilation: Secondary | ICD-10-CM | POA: Diagnosis not present

## 2021-08-06 DIAGNOSIS — G4733 Obstructive sleep apnea (adult) (pediatric): Secondary | ICD-10-CM | POA: Diagnosis not present

## 2021-08-06 DIAGNOSIS — R5383 Other fatigue: Secondary | ICD-10-CM | POA: Diagnosis not present

## 2021-09-27 DIAGNOSIS — G4733 Obstructive sleep apnea (adult) (pediatric): Secondary | ICD-10-CM | POA: Diagnosis not present

## 2021-10-05 DIAGNOSIS — Z122 Encounter for screening for malignant neoplasm of respiratory organs: Secondary | ICD-10-CM | POA: Diagnosis not present

## 2021-10-05 DIAGNOSIS — Z87891 Personal history of nicotine dependence: Secondary | ICD-10-CM | POA: Diagnosis not present

## 2021-10-12 DIAGNOSIS — E662 Morbid (severe) obesity with alveolar hypoventilation: Secondary | ICD-10-CM | POA: Diagnosis not present

## 2021-10-12 DIAGNOSIS — J4530 Mild persistent asthma, uncomplicated: Secondary | ICD-10-CM | POA: Diagnosis not present

## 2021-10-12 DIAGNOSIS — G4733 Obstructive sleep apnea (adult) (pediatric): Secondary | ICD-10-CM | POA: Diagnosis not present

## 2021-10-12 DIAGNOSIS — R5383 Other fatigue: Secondary | ICD-10-CM | POA: Diagnosis not present

## 2021-10-28 DIAGNOSIS — G4733 Obstructive sleep apnea (adult) (pediatric): Secondary | ICD-10-CM | POA: Diagnosis not present

## 2021-11-12 DIAGNOSIS — J4530 Mild persistent asthma, uncomplicated: Secondary | ICD-10-CM | POA: Diagnosis not present

## 2021-11-12 DIAGNOSIS — R5383 Other fatigue: Secondary | ICD-10-CM | POA: Diagnosis not present

## 2021-11-12 DIAGNOSIS — G4733 Obstructive sleep apnea (adult) (pediatric): Secondary | ICD-10-CM | POA: Diagnosis not present

## 2021-11-25 DIAGNOSIS — J449 Chronic obstructive pulmonary disease, unspecified: Secondary | ICD-10-CM | POA: Diagnosis not present

## 2021-11-25 DIAGNOSIS — R0602 Shortness of breath: Secondary | ICD-10-CM | POA: Diagnosis not present

## 2021-11-25 DIAGNOSIS — Z20828 Contact with and (suspected) exposure to other viral communicable diseases: Secondary | ICD-10-CM | POA: Diagnosis not present

## 2021-11-25 DIAGNOSIS — J189 Pneumonia, unspecified organism: Secondary | ICD-10-CM | POA: Diagnosis not present

## 2021-11-27 DIAGNOSIS — G4733 Obstructive sleep apnea (adult) (pediatric): Secondary | ICD-10-CM | POA: Diagnosis not present

## 2021-12-19 ENCOUNTER — Ambulatory Visit: Payer: BC Managed Care – PPO | Admitting: Legal Medicine

## 2021-12-31 ENCOUNTER — Other Ambulatory Visit: Payer: Self-pay | Admitting: Legal Medicine

## 2021-12-31 NOTE — Progress Notes (Signed)
Subjective:  Patient ID: Victor Hamilton, male    DOB: 1961-06-19  Age: 61 y.o. MRN: [id number]  Chief Complaint  Patient presents with   Prediabetes   Asthma    HPI: chronic visit   Asthma: Patient is using Breo Ellipta 1 inhale once a day, albuterol inhaler 2 puff every 6 hours prn.Some exacerbations of asthma PRN  Prediabetes: He is watching his diet and trying to eat low carbs diet.  BPH stable no medicines now.  Current Outpatient Medications on File Prior to Visit  Medication Sig Dispense Refill   albuterol (VENTOLIN HFA) 108 (90 Base) MCG/ACT inhaler Inhale 2 puffs into the lungs every 6 (six) hours as needed for wheezing or shortness of breath. 8 g 6   BREO ELLIPTA 100-25 MCG/ACT AEPB TAKE 1 INHALATION ONCE A DAY 180 each 1   cyanocobalamin 1000 MCG tablet Take by mouth.     furosemide (LASIX) 20 MG tablet TAKE 1 TABLET BY MOUTH EVERY DAY 90 tablet 2   montelukast (SINGULAIR) 10 MG tablet Take 10 mg by mouth daily.     Potassium Gluconate 2.5 MEQ TABS Take by mouth.     No current facility-administered medications on file prior to visit.   Past Medical History:  Diagnosis Date   Morbid obesity due to excess calories (HCC)    Obstructive sleep apnea (adult) (pediatric)    History reviewed. No pertinent surgical history.  Family History  Problem Relation Age of Onset   Transient ischemic attack Mother    Hypertension Father    Heart attack Father    Diabetes Father    Social History   Socioeconomic History   Marital status: Divorced    Spouse name: Not on file   Number of children: 1   Years of education: Not on file   Highest education level: Not on file  Occupational History   Not on file  Tobacco Use   Smoking status: Never   Smokeless tobacco: Never  Vaping Use   Vaping Use: Never used  Substance and Sexual Activity   Alcohol use: Not Currently   Drug use: Not Currently   Sexual activity: Not on file  Other Topics Concern   Not on file  Social  History Narrative   Not on file   Social Determinants of Health   Financial Resource Strain: Not on file  Food Insecurity: Not on file  Transportation Needs: Not on file  Physical Activity: Not on file  Stress: Not on file  Social Connections: Not on file    Review of Systems  Constitutional:  Negative for chills, fatigue, fever and unexpected weight change.  HENT:  Negative for congestion, ear pain, sinus pain and sore throat.   Eyes:  Negative for visual disturbance.  Respiratory:  Negative for cough and shortness of breath.   Cardiovascular:  Negative for chest pain and palpitations.  Gastrointestinal:  Negative for abdominal pain, blood in stool, constipation, diarrhea, nausea and vomiting.  Endocrine: Negative for polydipsia.  Genitourinary:  Negative for dysuria.  Musculoskeletal:  Negative for back pain.  Skin:  Negative for rash.  Neurological:  Negative for headaches.  Psychiatric/Behavioral: Negative.       Objective:  BP (!) 150/90   Pulse 85   Temp 98.1 F (36.7 C)   Resp 15   Ht 5\' 10"  (1.778 m)   Wt (!) 409 lb (185.5 kg)   SpO2 93%   BMI 58.69 kg/m      01/01/2022  8:27 AM 07/25/2021    3:19 PM 07/17/2021    3:20 PM  BP/Weight  Systolic BP 150 130 140  Diastolic BP 90 80 80  Wt. (Lbs) 409 415 414  BMI 58.69 kg/m2 59.55 kg/m2 59.4 kg/m2    Physical Exam Vitals reviewed.  Constitutional:      General: He is not in acute distress.    Appearance: Normal appearance. He is obese.  HENT:     Head: Normocephalic and atraumatic.     Right Ear: Tympanic membrane normal.     Left Ear: Tympanic membrane normal.     Nose: Congestion present.     Mouth/Throat:     Pharynx: Oropharyngeal exudate present.  Eyes:     Extraocular Movements: Extraocular movements intact.     Conjunctiva/sclera: Conjunctivae normal.     Pupils: Pupils are equal, round, and reactive to light.  Cardiovascular:     Rate and Rhythm: Normal rate and regular rhythm.      Pulses: Normal pulses.     Heart sounds: Normal heart sounds. No murmur heard.    No gallop.  Pulmonary:     Effort: Pulmonary effort is normal. No respiratory distress.     Breath sounds: Normal breath sounds. No wheezing.  Abdominal:     General: Abdomen is flat. Bowel sounds are normal. There is no distension.     Tenderness: There is no abdominal tenderness.  Musculoskeletal:        General: Swelling present.     Cervical back: Normal range of motion and neck supple.     Right lower leg: No edema.     Left lower leg: Edema present.  Skin:    General: Skin is warm.     Capillary Refill: Capillary refill takes less than 2 seconds.  Neurological:     General: No focal deficit present.     Mental Status: He is alert and oriented to person, place, and time. Mental status is at baseline.     Gait: Gait normal.     Deep Tendon Reflexes: Reflexes normal.  Psychiatric:        Mood and Affect: Mood normal.        Thought Content: Thought content normal.     Diabetic Foot Exam - Simple   Simple Foot Form Diabetic Foot exam was performed with the following findings: Yes 01/01/2022  8:49 AM  Visual Inspection No deformities, no ulcerations, no other skin breakdown bilaterally: Yes Sensation Testing Intact to touch and monofilament testing bilaterally: Yes Pulse Check Posterior Tibialis and Dorsalis pulse intact bilaterally: Yes Comments      Lab Results  Component Value Date   WBC 9.7 06/21/2021   HGB 13.4 06/21/2021   HCT 42.3 06/21/2021   PLT 312 06/21/2021   GLUCOSE 120 (H) 06/21/2021   CHOL 137 05/25/2020   TRIG 75 05/25/2020   HDL 34 (L) 05/25/2020   LDLCALC 88 05/25/2020   ALT 31 06/21/2021   AST 25 06/21/2021   NA 142 06/21/2021   K 4.4 06/21/2021   CL 101 06/21/2021   CREATININE 0.90 06/21/2021   BUN 11 06/21/2021   CO2 27 06/21/2021   HGBA1C 6.4 (H) 06/21/2021      Assessment & Plan:   Problem List Items Addressed This Visit       Respiratory    Obstructive sleep apnea (adult) (pediatric) Using CPAP consistently every night and medically benefiting from its use.     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  The patient's status was assessed using clinical findings on exam, labs, and other diagnostic testing. Patient's success at meeting treatment goals based on disease specific evidence-bassed guidelines and found to be in good control. RECOMMENDATIONS include maintaining present medicines and treatment.      Other   Morbid obesity due to excess calories (HCC) BMI 58   Relevant Medications   Semaglutide,0.25 or 0.5MG /DOS, 2 MG/1.5ML SOPN   Other Relevant Orders   Lipid panel   CBC with Differential/Platelet An individualize plan was formulated for obesity using patient history and physical exam to encourage weight loss.  An evidence based program was formulated.  Patient is to cut portion size with meals and to plan physical exercise 3 days a week at least 20 minutes.  Weight watchers and other programs are helpful.  Planned amount of weight loss 10 lbs. Starting ozempic   Prediabetes - Primary   Relevant Medications   Semaglutide,0.25 or 0.5MG /DOS, 2 MG/1.5ML SOPN   Other Relevant Orders   Comprehensive metabolic panel   Hemoglobin A1c Prediabetes doing well, start ozempic  .       Follow-up: Return in about 6 months (around 07/03/2022).  An After Visit Summary was printed and given to the patient.  Brent BullaLawrence Cinde Ebert, MD [facility] [phone]

## 2022-01-01 ENCOUNTER — Ambulatory Visit: Payer: BLUE CROSS/BLUE SHIELD | Admitting: Legal Medicine

## 2022-01-01 ENCOUNTER — Encounter: Payer: Self-pay | Admitting: Legal Medicine

## 2022-01-01 VITALS — BP 150/90 | HR 85 | Temp 98.1°F | Resp 15 | Ht 70.0 in | Wt >= 6400 oz

## 2022-01-01 DIAGNOSIS — J4520 Mild intermittent asthma, uncomplicated: Secondary | ICD-10-CM | POA: Diagnosis not present

## 2022-01-01 DIAGNOSIS — G4733 Obstructive sleep apnea (adult) (pediatric): Secondary | ICD-10-CM | POA: Diagnosis not present

## 2022-01-01 DIAGNOSIS — R7303 Prediabetes: Secondary | ICD-10-CM

## 2022-01-01 DIAGNOSIS — N400 Enlarged prostate without lower urinary tract symptoms: Secondary | ICD-10-CM

## 2022-01-01 MED ORDER — SEMAGLUTIDE(0.25 OR 0.5MG/DOS) 2 MG/1.5ML ~~LOC~~ SOPN: 0.5000 mg | PEN_INJECTOR | SUBCUTANEOUS | 2 refills | Status: DC

## 2022-01-02 ENCOUNTER — Other Ambulatory Visit: Payer: Self-pay

## 2022-01-02 DIAGNOSIS — E875 Hyperkalemia: Secondary | ICD-10-CM

## 2022-01-02 LAB — CBC WITH DIFFERENTIAL/PLATELET
Basophils Absolute: 0 10*3/uL (ref 0.0–0.2)
Basos: 1 %
EOS (ABSOLUTE): 0.2 10*3/uL (ref 0.0–0.4)
Eos: 3 %
Hematocrit: 43.1 % (ref 37.5–51.0)
Hemoglobin: 13.4 g/dL (ref 13.0–17.7)
Immature Grans (Abs): 0 10*3/uL (ref 0.0–0.1)
Immature Granulocytes: 1 %
Lymphocytes Absolute: 1.2 10*3/uL (ref 0.7–3.1)
Lymphs: 15 %
MCH: 26.1 pg — ABNORMAL LOW (ref 26.6–33.0)
MCHC: 31.1 g/dL — ABNORMAL LOW (ref 31.5–35.7)
MCV: 84 fL (ref 79–97)
Monocytes Absolute: 0.8 10*3/uL (ref 0.1–0.9)
Monocytes: 10 %
Neutrophils Absolute: 5.5 10*3/uL (ref 1.4–7.0)
Neutrophils: 70 %
Platelets: 360 10*3/uL (ref 150–450)
RBC: 5.13 x10E6/uL (ref 4.14–5.80)
RDW: 15.5 % — ABNORMAL HIGH (ref 11.6–15.4)
WBC: 7.8 10*3/uL (ref 3.4–10.8)

## 2022-01-02 LAB — COMPREHENSIVE METABOLIC PANEL
ALT: 17 IU/L (ref 0–44)
AST: 12 IU/L (ref 0–40)
Albumin/Globulin Ratio: 1.5 (ref 1.2–2.2)
Albumin: 4.1 g/dL (ref 3.8–4.9)
Alkaline Phosphatase: 85 IU/L (ref 44–121)
BUN/Creatinine Ratio: 12 (ref 10–24)
BUN: 11 mg/dL (ref 8–27)
Bilirubin Total: 0.2 mg/dL (ref 0.0–1.2)
CO2: 28 mmol/L (ref 20–29)
Calcium: 9.3 mg/dL (ref 8.6–10.2)
Chloride: 102 mmol/L (ref 96–106)
Creatinine, Ser: 0.93 mg/dL (ref 0.76–1.27)
Globulin, Total: 2.7 g/dL (ref 1.5–4.5)
Glucose: 123 mg/dL — ABNORMAL HIGH (ref 70–99)
Potassium: 5.4 mmol/L — ABNORMAL HIGH (ref 3.5–5.2)
Sodium: 143 mmol/L (ref 134–144)
Total Protein: 6.8 g/dL (ref 6.0–8.5)
eGFR: 94 mL/min/{1.73_m2} (ref >59)

## 2022-01-02 LAB — HEMOGLOBIN A1C
Est. average glucose Bld gHb Est-mCnc: 137 mg/dL
Hgb A1c MFr Bld: 6.4 % — ABNORMAL HIGH (ref 4.8–5.6)

## 2022-01-02 LAB — LIPID PANEL
Chol/HDL Ratio: 4.3 ratio (ref 0.0–5.0)
Cholesterol, Total: 158 mg/dL (ref 100–199)
HDL: 37 mg/dL — ABNORMAL LOW (ref >39)
LDL Chol Calc (NIH): 105 mg/dL — ABNORMAL HIGH (ref 0–99)
Triglycerides: 83 mg/dL (ref 0–149)
VLDL Cholesterol Cal: 16 mg/dL (ref 5–40)

## 2022-01-02 LAB — CARDIOVASCULAR RISK ASSESSMENT

## 2022-01-02 NOTE — Progress Notes (Signed)
Glucose 123, potassium 5.4 high, recheck one week, kidney tests normal, Liver tests normal, A1 6.4, LDL cholesterol 105 still high, CBC ok,  lp

## 2022-01-09 ENCOUNTER — Other Ambulatory Visit: Payer: BLUE CROSS/BLUE SHIELD

## 2022-01-09 DIAGNOSIS — E875 Hyperkalemia: Secondary | ICD-10-CM

## 2022-01-10 LAB — COMPREHENSIVE METABOLIC PANEL
ALT: 15 IU/L (ref 0–44)
AST: 15 IU/L (ref 0–40)
Albumin/Globulin Ratio: 1.7 (ref 1.2–2.2)
Albumin: 4.3 g/dL (ref 3.8–4.9)
Alkaline Phosphatase: 82 IU/L (ref 44–121)
BUN/Creatinine Ratio: 11 (ref 10–24)
BUN: 12 mg/dL (ref 8–27)
Bilirubin Total: 0.2 mg/dL (ref 0.0–1.2)
CO2: 28 mmol/L (ref 20–29)
Calcium: 9.6 mg/dL (ref 8.6–10.2)
Chloride: 101 mmol/L (ref 96–106)
Creatinine, Ser: 1.05 mg/dL (ref 0.76–1.27)
Globulin, Total: 2.6 g/dL (ref 1.5–4.5)
Glucose: 119 mg/dL — ABNORMAL HIGH (ref 70–99)
Potassium: 4.4 mmol/L (ref 3.5–5.2)
Sodium: 143 mmol/L (ref 134–144)
Total Protein: 6.9 g/dL (ref 6.0–8.5)
eGFR: 81 mL/min/{1.73_m2} (ref >59)

## 2022-03-15 ENCOUNTER — Other Ambulatory Visit: Payer: Self-pay | Admitting: Legal Medicine

## 2022-03-15 DIAGNOSIS — R7303 Prediabetes: Secondary | ICD-10-CM

## 2022-03-21 ENCOUNTER — Telehealth: Payer: Self-pay

## 2022-03-21 DIAGNOSIS — R051 Acute cough: Secondary | ICD-10-CM | POA: Diagnosis not present

## 2022-03-21 DIAGNOSIS — R197 Diarrhea, unspecified: Secondary | ICD-10-CM | POA: Diagnosis not present

## 2022-03-21 NOTE — Patient Outreach (Signed)
  Care Coordination   03/21/2022 Name: Victor Hamilton MRN: [id number] DOB: Jun 25, 1961   Care Coordination Outreach Attempts:  An unsuccessful telephone outreach was attempted today to offer the patient information about available care coordination services as a benefit of their health plan.   Follow Up Plan:  Additional outreach attempts will be made to offer the patient care coordination information and services.   Encounter Outcome:  No Answer  Care Coordination Interventions Activated:  No   Care Coordination Interventions:  No, not indicated    Rowe Pavy, RN, BSN, CEN [facility] [phone]

## 2022-03-21 NOTE — Patient Outreach (Signed)
  Care Coordination   Initial Visit Note   03/21/2022 Name: Victor Hamilton MRN: [id number] DOB: April 25, 1961  Victor Hamilton is a 61 y.o. year old male who sees Abigail Miyamoto, MD for primary care. I spoke with  Madelyn Brunner by phone today.  What matters to the patients health and wellness today?  Return call from patient. Reviewed [facility] care coordination program and patient has agreed to services. Appointment scheduled for 03/27/2022. Patient works til 330pm     SDOH assessments and interventions completed:       Care Coordination Interventions Activated:  No  Care Coordination Interventions:  No, not indicated   Follow up plan: Follow up call scheduled for 03/27/2022 at 4pm    Encounter Outcome:  Pt. Scheduled   Rowe Pavy, RN, BSN, CEN [facility] Coordinator [phone]

## 2022-03-27 ENCOUNTER — Ambulatory Visit: Payer: Self-pay

## 2022-03-27 NOTE — Patient Outreach (Signed)
  Care Coordination   03/27/2022 Name: BROLIN DAMBROSIA MRN: [id number] DOB: 12-Dec-1960   Care Coordination Outreach Attempts:  An unsuccessful telephone outreach was attempted for a scheduled appointment today.  Follow Up Plan:  Additional outreach attempts will be made to offer the patient care coordination information and services.   Encounter Outcome:  No Answer  Care Coordination Interventions Activated:  No   Care Coordination Interventions:  No, not indicated    Rowe Pavy, RN, BSN, CEN [facility] [phone]

## 2022-05-09 DIAGNOSIS — Z87891 Personal history of nicotine dependence: Secondary | ICD-10-CM | POA: Diagnosis not present

## 2022-05-09 DIAGNOSIS — R109 Unspecified abdominal pain: Secondary | ICD-10-CM | POA: Diagnosis not present

## 2022-05-09 DIAGNOSIS — M792 Neuralgia and neuritis, unspecified: Secondary | ICD-10-CM | POA: Diagnosis not present

## 2022-05-09 DIAGNOSIS — G629 Polyneuropathy, unspecified: Secondary | ICD-10-CM | POA: Diagnosis not present

## 2022-05-09 DIAGNOSIS — Z6841 Body Mass Index (BMI) 40.0 and over, adult: Secondary | ICD-10-CM | POA: Diagnosis not present

## 2022-05-28 ENCOUNTER — Encounter: Payer: Self-pay | Admitting: Legal Medicine

## 2022-05-28 ENCOUNTER — Ambulatory Visit: Payer: BLUE CROSS/BLUE SHIELD | Admitting: Legal Medicine

## 2022-05-28 VITALS — BP 140/88 | HR 94 | Temp 98.9°F | Resp 17 | Ht 70.0 in | Wt >= 6400 oz

## 2022-05-28 DIAGNOSIS — I10 Essential (primary) hypertension: Secondary | ICD-10-CM | POA: Diagnosis not present

## 2022-05-28 DIAGNOSIS — R7303 Prediabetes: Secondary | ICD-10-CM

## 2022-05-28 DIAGNOSIS — R609 Edema, unspecified: Secondary | ICD-10-CM | POA: Diagnosis not present

## 2022-05-28 DIAGNOSIS — J4520 Mild intermittent asthma, uncomplicated: Secondary | ICD-10-CM

## 2022-05-28 DIAGNOSIS — R600 Localized edema: Secondary | ICD-10-CM | POA: Insufficient documentation

## 2022-05-28 DIAGNOSIS — E875 Hyperkalemia: Secondary | ICD-10-CM

## 2022-05-28 DIAGNOSIS — Z6841 Body Mass Index (BMI) 40.0 and over, adult: Secondary | ICD-10-CM

## 2022-05-28 MED ORDER — FUROSEMIDE 40 MG PO TABS: 40.0000 mg | ORAL_TABLET | Freq: Two times a day (BID) | ORAL | 3 refills | Status: DC

## 2022-05-28 MED ORDER — ALBUTEROL SULFATE HFA 108 (90 BASE) MCG/ACT IN AERS: 2.0000 | INHALATION_SPRAY | Freq: Four times a day (QID) | RESPIRATORY_TRACT | 6 refills | Status: DC | PRN

## 2022-05-28 MED ORDER — ENALAPRIL MALEATE 10 MG PO TABS: 10.0000 mg | ORAL_TABLET | Freq: Every day | ORAL | 2 refills | Status: DC

## 2022-05-28 NOTE — Progress Notes (Unsigned)
Subjective:  Patient ID: Victor Hamilton, male    DOB: [birth year]  Age: 61 y.o. MRN: [id number]  Chief Complaint  Patient presents with   Leg Swelling   Weight Loss    HPI  Patient was taking Ozempic for prediabetes and weigh loss, but health insurance denied because he did not get the requirements. He would like to try something else for weight loss. He has DOE, no chest pain.  He also noticed few weeks ago the both legs are swelling and left leg is dripping.  OSA with BiPAP   Current Outpatient Medications on File Prior to Visit  Medication Sig Dispense Refill   AMERICAN GINSENG PO Take 500 mg by mouth daily.     BREO ELLIPTA 100-25 MCG/ACT AEPB TAKE 1 INHALATION ONCE A DAY 180 each 1   gabapentin (NEURONTIN) 300 MG capsule Take 300 mg by mouth 2 (two) times daily.     lidocaine (LIDODERM) 5 % 1 patch daily.     montelukast (SINGULAIR) 10 MG tablet Take 10 mg by mouth daily.     Potassium Gluconate 2.5 MEQ TABS Take by mouth. (Patient not taking: Reported on 05/28/2022)     valACYclovir (VALTREX) 1000 MG tablet Take 1,000 mg by mouth 3 (three) times daily. (Patient not taking: Reported on 05/28/2022)     No current facility-administered medications on file prior to visit.   Past Medical History:  Diagnosis Date   Morbid obesity due to excess calories ([place])    Obstructive sleep apnea (adult) (pediatric)    History reviewed. No pertinent surgical history.  Family History  Problem Relation Age of Onset   Transient ischemic attack Mother    Hypertension Father    Heart attack Father    Diabetes Father    Social History   Socioeconomic History   Marital status: Divorced    Spouse name: Not on file   Number of children: 1   Years of education: Not on file   Highest education level: Not on file  Occupational History   Not on file  Tobacco Use   Smoking status: Never   Smokeless tobacco: Never  Vaping Use   Vaping Use: Never used  Substance and Sexual Activity    Alcohol use: Not Currently   Drug use: Not Currently   Sexual activity: Not on file  Other Topics Concern   Not on file  Social History Narrative   Not on file   Social Determinants of Health   Financial Resource Strain: Not on file  Food Insecurity: Not on file  Transportation Needs: Not on file  Physical Activity: Not on file  Stress: Not on file  Social Connections: Not on file    Review of Systems  Constitutional:  Positive for unexpected weight change. Negative for chills, fatigue and fever.  HENT:  Negative for congestion, ear pain, sinus pain and sore throat.   Eyes:  Negative for visual disturbance.  Respiratory:  Positive for shortness of breath. Negative for cough and chest tightness.   Cardiovascular:  Positive for leg swelling. Negative for chest pain and palpitations.  Gastrointestinal:  Negative for abdominal pain, blood in stool, constipation, diarrhea, nausea and vomiting.  Endocrine: Negative for polydipsia.  Genitourinary:  Negative for dysuria.  Musculoskeletal:  Negative for back pain.  Skin: Negative.  Negative for rash.  Neurological:  Negative for headaches.  Psychiatric/Behavioral: Negative.       Objective:  BP (!) 140/88   Pulse 94   Temp 98.9  F (37.2 C)   Resp 17   Ht 5\' 10"  (1.778 m)   Wt (!) 432 lb (196 kg)   SpO2 92%   BMI 61.99 kg/m      05/28/2022   10:35 AM 01/01/2022    8:27 AM 07/25/2021    3:19 PM  BP/Weight  Systolic BP 140 150 130  Diastolic BP 88 90 80  Wt. (Lbs) 432 409 415  BMI 61.99 kg/m2 58.69 kg/m2 59.55 kg/m2    Physical Exam Vitals reviewed.  Constitutional:      Appearance: Normal appearance. He is obese. He is ill-appearing.  HENT:     Head: Normocephalic.     Right Ear: Tympanic membrane normal.     Left Ear: Tympanic membrane normal.     Nose: Nose normal.     Mouth/Throat:     Mouth: Mucous membranes are moist.     Pharynx: Oropharynx is clear.  Eyes:     Extraocular Movements: Extraocular movements  intact.     Conjunctiva/sclera: Conjunctivae normal.     Pupils: Pupils are equal, round, and reactive to light.  Cardiovascular:     Rate and Rhythm: Normal rate and regular rhythm.     Pulses: Normal pulses.     Heart sounds: Normal heart sounds. No murmur heard.    No gallop.  Pulmonary:     Effort: Respiratory distress present.     Breath sounds: Rhonchi present.  Abdominal:     General: Abdomen is flat. Bowel sounds are normal. There is distension.  Musculoskeletal:     Right lower leg: Edema present.     Left lower leg: Edema present.  Skin:    General: Skin is warm.     Capillary Refill: Capillary refill takes less than 2 seconds.  Neurological:     General: No focal deficit present.     Mental Status: He is alert and oriented to person, place, and time. Mental status is at baseline.     Gait: Gait normal.     Deep Tendon Reflexes: Reflexes normal.  Psychiatric:        Mood and Affect: Mood normal.        Thought Content: Thought content normal.        Judgment: Judgment normal.   EKG: NSR. LAE, RBBB, LAH, rate 87 axis -58  Diabetic Foot Exam - Simple   Simple Foot Form Diabetic Foot exam was performed with the following findings: Yes 05/28/2022 11:10 AM  Visual Inspection No deformities, no ulcerations, no other skin breakdown bilaterally: Yes Sensation Testing Intact to touch and monofilament testing bilaterally: Yes Pulse Check Posterior Tibialis and Dorsalis pulse intact bilaterally: Yes Comments Serosanguanous leg drainage      Lab Results  Component Value Date   WBC 9.6 05/28/2022   HGB 11.8 (L) 05/28/2022   HCT 39.0 05/28/2022   PLT 367 05/28/2022   GLUCOSE 98 05/28/2022   CHOL 158 01/01/2022   TRIG 83 01/01/2022   HDL 37 (L) 01/01/2022   LDLCALC 105 (H) 01/01/2022   ALT 20 05/28/2022   AST 20 05/28/2022   NA 142 05/28/2022   K 4.6 05/28/2022   CL 101 05/28/2022   CREATININE 0.95 05/28/2022   BUN 13 05/28/2022   CO2 29 05/28/2022   HGBA1C  6.3 (H) 05/28/2022      Assessment & Plan:   Problem List Items Addressed This Visit       Respiratory   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  An evidence based program was formulated.  Patient is to cut portion size with meals and to plan physical exercise 3 days a week at least 20 minutes.  Weight watchers and other programs are helpful.  Planned amount of weight loss 10 lbs.    Prediabetes   Relevant Orders   Hemoglobin A1c (Completed) Patient has prediabetes no staying with diet    Peripheral edema - Primary   Relevant Orders   Ambulatory referral to Vascular Surgery   Ambulatory referral to Cardiology Severe edema legs, bilateral unna boots applied for one week.  The left leg is oozing   Other Visit Diagnoses     Hyperkalemia       Relevant Orders   CBC with Differential/Platelet (Completed)   Comprehensive metabolic panel (Completed) Patient had hyperkalemia last visit but did not return for recheck   Primary hypertension       Relevant Medications   enalapril (VASOTEC) 10 MG tablet   furosemide (LASIX) 40 MG tablet   Other Relevant Orders   Pro b natriuretic peptide (Completed)   EKG 12-Lead   Ambulatory referral to Cardiology Start ARB and increase lasix with edema.     . A total of 40 minutes were spent face-to-face with the patient during this encounter and over half of that time was spent on counseling and coordination of care.   Meds ordered this encounter  Medications   albuterol (VENTOLIN HFA) 108 (90 Base) MCG/ACT inhaler    Sig: Inhale 2 puffs into the lungs every 6 (six) hours as needed for wheezing or  shortness of breath.    Dispense:  8 g    Refill:  6   enalapril (VASOTEC) 10 MG tablet    Sig: Take 1 tablet (10 mg total) by mouth daily.    Dispense:  90 tablet    Refill:  2   furosemide (LASIX) 40 MG tablet    Sig: Take 1 tablet (40 mg total) by mouth 2 (two) times daily.    Dispense:  60 tablet    Refill:  3    Orders Placed This Encounter  Procedures   CBC with Differential/Platelet   Comprehensive metabolic panel   Hemoglobin A1c   Pro b natriuretic peptide   Ambulatory referral to Vascular Surgery   Ambulatory referral to Cardiology   EKG 12-Lead     Follow-up: Return in about 1 week (around 06/04/2022), or unna boots.  An After Visit Summary was printed and given to the patient.  Reinaldo Meeker, MD [facility] [phone]

## 2022-05-29 ENCOUNTER — Ambulatory Visit: Payer: BLUE CROSS/BLUE SHIELD | Admitting: Legal Medicine

## 2022-05-29 LAB — CBC WITH DIFFERENTIAL/PLATELET
Basophils Absolute: 0.1 10*3/uL (ref 0.0–0.2)
Basos: 1 %
EOS (ABSOLUTE): 0.3 10*3/uL (ref 0.0–0.4)
Eos: 3 %
Hematocrit: 39 % (ref 37.5–51.0)
Hemoglobin: 11.8 g/dL — ABNORMAL LOW (ref 13.0–17.7)
Immature Grans (Abs): 0 10*3/uL (ref 0.0–0.1)
Immature Granulocytes: 0 %
Lymphocytes Absolute: 1.4 10*3/uL (ref 0.7–3.1)
Lymphs: 15 %
MCH: 25.7 pg — ABNORMAL LOW (ref 26.6–33.0)
MCHC: 30.3 g/dL — ABNORMAL LOW (ref 31.5–35.7)
MCV: 85 fL (ref 79–97)
Monocytes Absolute: 0.9 10*3/uL (ref 0.1–0.9)
Monocytes: 10 %
Neutrophils Absolute: 6.9 10*3/uL (ref 1.4–7.0)
Neutrophils: 71 %
Platelets: 367 10*3/uL (ref 150–450)
RBC: 4.6 x10E6/uL (ref 4.14–5.80)
RDW: 14.8 % (ref 11.6–15.4)
WBC: 9.6 10*3/uL (ref 3.4–10.8)

## 2022-05-29 LAB — COMPREHENSIVE METABOLIC PANEL
ALT: 20 IU/L (ref 0–44)
AST: 20 IU/L (ref 0–40)
Albumin/Globulin Ratio: 1.6 (ref 1.2–2.2)
Albumin: 3.9 g/dL (ref 3.8–4.9)
Alkaline Phosphatase: 81 IU/L (ref 44–121)
BUN/Creatinine Ratio: 14 (ref 10–24)
BUN: 13 mg/dL (ref 8–27)
Bilirubin Total: 0.4 mg/dL (ref 0.0–1.2)
CO2: 29 mmol/L (ref 20–29)
Calcium: 9.1 mg/dL (ref 8.6–10.2)
Chloride: 101 mmol/L (ref 96–106)
Creatinine, Ser: 0.95 mg/dL (ref 0.76–1.27)
Globulin, Total: 2.5 g/dL (ref 1.5–4.5)
Glucose: 98 mg/dL (ref 70–99)
Potassium: 4.6 mmol/L (ref 3.5–5.2)
Sodium: 142 mmol/L (ref 134–144)
Total Protein: 6.4 g/dL (ref 6.0–8.5)
eGFR: 92 mL/min/{1.73_m2} (ref >59)

## 2022-05-29 LAB — HEMOGLOBIN A1C
Est. average glucose Bld gHb Est-mCnc: 134 mg/dL
Hgb A1c MFr Bld: 6.3 % — ABNORMAL HIGH (ref 4.8–5.6)

## 2022-05-29 LAB — PRO B NATRIURETIC PEPTIDE: NT-Pro BNP: 922 pg/mL — ABNORMAL HIGH (ref 0–210)

## 2022-05-29 NOTE — Progress Notes (Signed)
Hemoglobin low, A1c 6.3 still prediabetes, BNP high 922, fluid overload. Glucose 98, kidney and liver tests normal lp

## 2022-05-30 ENCOUNTER — Other Ambulatory Visit: Payer: Self-pay

## 2022-05-30 DIAGNOSIS — Z6841 Body Mass Index (BMI) 40.0 and over, adult: Secondary | ICD-10-CM

## 2022-05-30 MED ORDER — WEGOVY 0.25 MG/0.5ML ~~LOC~~ SOAJ: 0.2500 mg | SUBCUTANEOUS | 1 refills | Status: DC

## 2022-06-04 ENCOUNTER — Ambulatory Visit: Payer: BLUE CROSS/BLUE SHIELD | Admitting: Legal Medicine

## 2022-06-04 ENCOUNTER — Other Ambulatory Visit: Payer: Self-pay | Admitting: Legal Medicine

## 2022-06-04 ENCOUNTER — Encounter: Payer: Self-pay | Admitting: Legal Medicine

## 2022-06-04 VITALS — BP 144/90 | HR 84 | Temp 98.4°F | Resp 16 | Ht 70.0 in | Wt >= 6400 oz

## 2022-06-04 DIAGNOSIS — R7303 Prediabetes: Secondary | ICD-10-CM

## 2022-06-04 DIAGNOSIS — I83029 Varicose veins of left lower extremity with ulcer of unspecified site: Secondary | ICD-10-CM | POA: Diagnosis not present

## 2022-06-04 DIAGNOSIS — Z6841 Body Mass Index (BMI) 40.0 and over, adult: Secondary | ICD-10-CM

## 2022-06-04 DIAGNOSIS — L97929 Non-pressure chronic ulcer of unspecified part of left lower leg with unspecified severity: Secondary | ICD-10-CM

## 2022-06-04 MED ORDER — SEMAGLUTIDE-WEIGHT MANAGEMENT 1 MG/0.5ML ~~LOC~~ SOAJ: 1.0000 mg | SUBCUTANEOUS | 6 refills | Status: DC

## 2022-06-04 NOTE — Progress Notes (Addendum)
Subjective:  Patient ID: Victor Hamilton, male    DOB: 1961/04/19  Age: 61 y.o. MRN: [id number]  Chief Complaint  Patient presents with   Leg Swelling    HPI  He noticed few weeks ago the both legs are swelling and left leg is dripping. His legs were wrapped and lasix increased and enalapril started.He has new ulcers left leg that are draining.  Lateral one 6cm- cultured, and a 1.5 cm ulcer anterior.  He is less SOB. Lost 18 lbs   Current Outpatient Medications on File Prior to Visit  Medication Sig Dispense Refill   albuterol (VENTOLIN HFA) 108 (90 Base) MCG/ACT inhaler Inhale 2 puffs into the lungs every 6 (six) hours as needed for wheezing or shortness of breath. 8 g 6   AMERICAN GINSENG PO Take 500 mg by mouth daily.     BREO ELLIPTA 100-25 MCG/ACT AEPB TAKE 1 INHALATION ONCE A DAY 180 each 1   enalapril (VASOTEC) 10 MG tablet Take 1 tablet (10 mg total) by mouth daily. 90 tablet 2   furosemide (LASIX) 40 MG tablet Take 1 tablet (40 mg total) by mouth 2 (two) times daily. 60 tablet 3   gabapentin (NEURONTIN) 300 MG capsule Take 300 mg by mouth 2 (two) times daily.     lidocaine (LIDODERM) 5 % 1 patch daily.     montelukast (SINGULAIR) 10 MG tablet Take 10 mg by mouth daily.     Potassium Gluconate 2.5 MEQ TABS Take by mouth.     valACYclovir (VALTREX) 1000 MG tablet Take 1,000 mg by mouth 3 (three) times daily.     No current facility-administered medications on file prior to visit.   Past Medical History:  Diagnosis Date   Morbid obesity due to excess calories (HCC)    Obstructive sleep apnea (adult) (pediatric)    History reviewed. No pertinent surgical history.  Family History  Problem Relation Age of Onset   Transient ischemic attack Mother    Hypertension Father    Heart attack Father    Diabetes Father    Social History   Socioeconomic History   Marital status: Divorced    Spouse name: Not on file   Number of children: 1   Years of education: Not on file    Highest education level: Not on file  Occupational History   Not on file  Tobacco Use   Smoking status: Never   Smokeless tobacco: Never  Vaping Use   Vaping Use: Never used  Substance and Sexual Activity   Alcohol use: Not Currently   Drug use: Not Currently   Sexual activity: Not on file  Other Topics Concern   Not on file  Social History Narrative   Not on file   Social Determinants of Health   Financial Resource Strain: Not on file  Food Insecurity: Not on file  Transportation Needs: Not on file  Physical Activity: Not on file  Stress: Not on file  Social Connections: Not on file    Review of Systems  Constitutional:  Negative for chills, fatigue, fever and unexpected weight change.  HENT:  Negative for congestion, ear pain, sinus pain and sore throat.   Respiratory:  Positive for shortness of breath. Negative for cough.   Cardiovascular:  Positive for leg swelling. Negative for chest pain and palpitations.  Gastrointestinal:  Negative for abdominal pain, blood in stool, constipation, diarrhea, nausea and vomiting.  Endocrine: Negative for polydipsia.  Genitourinary:  Negative for dysuria.  Musculoskeletal:  Negative for back pain.  Skin:  Negative for rash.  Neurological:  Negative for headaches.     Objective:  BP (!) 144/90   Pulse 84   Temp 98.4 F (36.9 C)   Resp 16   Ht 5\' 10"  (1.778 m)   Wt (!) 414 lb (187.8 kg)   SpO2 96%   BMI 59.40 kg/m      06/04/2022    3:32 PM 05/28/2022   10:35 AM 01/01/2022    8:27 AM  BP/Weight  Systolic BP 144 140 150  Diastolic BP 90 88 90  Wt. (Lbs) 414 432 409  BMI 59.4 kg/m2 61.99 kg/m2 58.69 kg/m2    Physical Exam Cardiovascular:     Pulses: Normal pulses.     Heart sounds: Normal heart sounds. No murmur heard.    No gallop.  Pulmonary:     Effort: Pulmonary effort is normal. No respiratory distress.     Breath sounds: Normal breath sounds. No wheezing.  Abdominal:     General: Abdomen is flat. Bowel  sounds are normal. There is no distension.     Palpations: Abdomen is soft.     Tenderness: There is no abdominal tenderness.  Musculoskeletal:     Cervical back: Normal range of motion.     Right lower leg: Edema present.     Left lower leg: Edema present.  Skin:    General: Skin is warm.     Capillary Refill: Capillary refill takes less than 2 seconds.     Findings: Erythema present.          Comments: ulcers  Neurological:     General: No focal deficit present.     Mental Status: He is oriented to person, place, and time. Mental status is at baseline.         Lab Results  Component Value Date   WBC 9.6 05/28/2022   HGB 11.8 (L) 05/28/2022   HCT 39.0 05/28/2022   PLT 367 05/28/2022   GLUCOSE 98 05/28/2022   CHOL 158 01/01/2022   TRIG 83 01/01/2022   HDL 37 (L) 01/01/2022   LDLCALC 105 (H) 01/01/2022   ALT 20 05/28/2022   AST 20 05/28/2022   NA 142 05/28/2022   K 4.6 05/28/2022   CL 101 05/28/2022   CREATININE 0.95 05/28/2022   BUN 13 05/28/2022   CO2 29 05/28/2022   HGBA1C 6.3 (H) 05/28/2022      Assessment & Plan:   Problem List Items Addressed This Visit       Cardiovascular and Mediastinum   Ulcerated leg varices, left (HCC) - Primary   Relevant Orders   WOUND CULTURE Patient has 2 new ulcers on left leg, cultured ulcer, wrap with unna boot and silvadiene, await culture for antibiotic.  He is to see cardiology soon  .    Orders Placed This Encounter  Procedures   WOUND CULTURE     Follow-up: Return in about 1 week (around 06/11/2022).  An After Visit Summary was printed and given to the patient.  06/13/2022, MD [facility] [phone]

## 2022-06-07 LAB — WOUND CULTURE

## 2022-06-08 ENCOUNTER — Other Ambulatory Visit: Payer: Self-pay | Admitting: Legal Medicine

## 2022-06-08 DIAGNOSIS — L97929 Non-pressure chronic ulcer of unspecified part of left lower leg with unspecified severity: Secondary | ICD-10-CM

## 2022-06-08 MED ORDER — CLINDAMYCIN HCL 150 MG PO CAPS: 150.0000 mg | ORAL_CAPSULE | Freq: Three times a day (TID) | ORAL | 1 refills | Status: DC

## 2022-06-11 ENCOUNTER — Encounter: Payer: Self-pay | Admitting: Legal Medicine

## 2022-06-11 ENCOUNTER — Ambulatory Visit: Payer: BLUE CROSS/BLUE SHIELD | Admitting: Legal Medicine

## 2022-06-11 VITALS — BP 128/72 | HR 88 | Temp 97.5°F | Ht 70.0 in | Wt >= 6400 oz

## 2022-06-11 DIAGNOSIS — L97929 Non-pressure chronic ulcer of unspecified part of left lower leg with unspecified severity: Secondary | ICD-10-CM

## 2022-06-11 DIAGNOSIS — I83029 Varicose veins of left lower extremity with ulcer of unspecified site: Secondary | ICD-10-CM | POA: Diagnosis not present

## 2022-06-11 NOTE — Progress Notes (Signed)
Subjective:  Patient ID: Victor Hamilton, male    DOB: March 01, 1961  Age: 61 y.o. MRN: [id number]  Chief Complaint  Patient presents with   Ulcerated Left leg    HPI: The leg left remains swollen but large ulcer from 6 cm to 4 cm shrunk in size. The anterior 1cm ulcer is clean.  Patient on antibiotics for staph aureus in large ulcer. Continue wrapping.   Current Outpatient Medications on File Prior to Visit  Medication Sig Dispense Refill   albuterol (VENTOLIN HFA) 108 (90 Base) MCG/ACT inhaler Inhale 2 puffs into the lungs every 6 (six) hours as needed for wheezing or shortness of breath. 8 g 6   AMERICAN GINSENG PO Take 500 mg by mouth daily.     BREO ELLIPTA 100-25 MCG/ACT AEPB TAKE 1 INHALATION ONCE A DAY 180 each 1   clindamycin (CLEOCIN) 150 MG capsule Take 1 capsule (150 mg total) by mouth 3 (three) times daily. 30 capsule 1   enalapril (VASOTEC) 10 MG tablet Take 1 tablet (10 mg total) by mouth daily. 90 tablet 2   furosemide (LASIX) 40 MG tablet Take 1 tablet (40 mg total) by mouth 2 (two) times daily. 60 tablet 3   gabapentin (NEURONTIN) 300 MG capsule Take 300 mg by mouth 2 (two) times daily.     lidocaine (LIDODERM) 5 % 1 patch daily.     montelukast (SINGULAIR) 10 MG tablet Take 10 mg by mouth daily.     Potassium Gluconate 2.5 MEQ TABS Take by mouth.     Semaglutide-Weight Management 1 MG/0.5ML SOAJ Inject 1 mg into the skin once a week. 2 mL 6   valACYclovir (VALTREX) 1000 MG tablet Take 1,000 mg by mouth 3 (three) times daily.     No current facility-administered medications on file prior to visit.   Past Medical History:  Diagnosis Date   Morbid obesity due to excess calories (HCC)    Obstructive sleep apnea (adult) (pediatric)    History reviewed. No pertinent surgical history.  Family History  Problem Relation Age of Onset   Transient ischemic attack Mother    Hypertension Father    Heart attack Father    Diabetes Father    Social History   Socioeconomic  History   Marital status: Divorced    Spouse name: Not on file   Number of children: 1   Years of education: Not on file   Highest education level: Not on file  Occupational History   Not on file  Tobacco Use   Smoking status: Never   Smokeless tobacco: Never  Vaping Use   Vaping Use: Never used  Substance and Sexual Activity   Alcohol use: Not Currently   Drug use: Not Currently   Sexual activity: Not on file  Other Topics Concern   Not on file  Social History Narrative   Not on file   Social Determinants of Health   Financial Resource Strain: Not on file  Food Insecurity: Not on file  Transportation Needs: Not on file  Physical Activity: Not on file  Stress: Not on file  Social Connections: Not on file    Review of Systems  Constitutional:  Negative for appetite change, fatigue and fever.  HENT:  Negative for congestion, ear pain, sinus pressure and sore throat.   Eyes: Negative.   Respiratory:  Negative for cough, chest tightness, shortness of breath and wheezing.   Cardiovascular:  Negative for chest pain and palpitations.  Gastrointestinal:  Negative for  abdominal pain, constipation, diarrhea, nausea and vomiting.  Genitourinary:  Negative for dysuria and hematuria.  Musculoskeletal:  Negative for arthralgias, back pain, joint swelling and myalgias.  Skin:  Positive for wound (Ulcerated Left leg). Negative for rash.  Neurological:  Negative for dizziness, weakness and headaches.  Psychiatric/Behavioral:  Negative for dysphoric mood. The patient is not nervous/anxious.      Objective:  BP 128/72 (BP Location: Left Arm, Patient Position: Sitting)   Pulse 88   Temp (!) 97.5 F (36.4 C) (Temporal)   Ht 5\' 10"  (1.778 m)   Wt (!) 406 lb (184.2 kg)   SpO2 98%   BMI 58.25 kg/m      06/11/2022    1:33 PM 06/04/2022    3:32 PM 05/28/2022   10:35 AM  BP/Weight  Systolic BP 0000000 [id number]  Diastolic BP 72 90 88  Wt. (Lbs) 406 414 432  BMI 58.25 kg/m2 59.4 kg/m2  61.99 kg/m2    Physical Exam Vitals reviewed.  Constitutional:      Appearance: Normal appearance. He is normal weight.  HENT:     Head: Normocephalic.     Right Ear: Tympanic membrane normal.     Left Ear: Tympanic membrane normal.     Nose: Nose normal.     Mouth/Throat:     Mouth: Mucous membranes are moist.     Pharynx: Oropharynx is clear.  Eyes:     Extraocular Movements: Extraocular movements intact.     Conjunctiva/sclera: Conjunctivae normal.     Pupils: Pupils are equal, round, and reactive to light.  Cardiovascular:     Rate and Rhythm: Normal rate and regular rhythm.     Pulses: Normal pulses.     Heart sounds: Normal heart sounds. No murmur heard.    No gallop.  Pulmonary:     Effort: Pulmonary effort is normal. No respiratory distress.     Breath sounds: Normal breath sounds. No wheezing.  Abdominal:     General: Abdomen is flat. Bowel sounds are normal. There is no distension.     Palpations: Abdomen is soft.     Tenderness: There is no abdominal tenderness.  Musculoskeletal:     Right lower leg: Edema present.     Left lower leg: Edema present.  Skin:    General: Skin is warm.     Capillary Refill: Capillary refill takes less than 2 seconds.     Comments: 6 cm ulcer lateral leg has reduced to 4cm and is shallow, the 1cm anterior ulcer is unchanged  Neurological:     General: No focal deficit present.     Mental Status: He is alert and oriented to person, place, and time. Mental status is at baseline.     Gait: Gait normal.     Deep Tendon Reflexes: Reflexes normal.  Psychiatric:        Mood and Affect: Mood normal.        Behavior: Behavior normal.         Lab Results  Component Value Date   WBC 9.6 05/28/2022   HGB 11.8 (L) 05/28/2022   HCT 39.0 05/28/2022   PLT 367 05/28/2022   GLUCOSE 98 05/28/2022   CHOL 158 01/01/2022   TRIG 83 01/01/2022   HDL 37 (L) 01/01/2022   LDLCALC 105 (H) 01/01/2022   ALT 20 05/28/2022   AST 20 05/28/2022    NA 142 05/28/2022   K 4.6 05/28/2022   CL 101 05/28/2022   CREATININE 0.95 05/28/2022  BUN 13 05/28/2022   CO2 29 05/28/2022   HGBA1C 6.3 (H) 05/28/2022      Assessment & Plan:   Problem List Items Addressed This Visit       Cardiovascular and Mediastinum   Ulcerated leg varices, left (HCC) - Primary The ulcers are healing well with compression and antibiotics.  Rewrap with unna boots  .       Follow-up: Return in about 1 week (around 06/18/2022).  An After Visit Summary was printed and given to the patient.    I,Lauren M Auman,acting as a scribe for Reinaldo Meeker, MD.,have documented all relevant documentation on the behalf of Reinaldo Meeker, MD,as directed by  Reinaldo Meeker, MD while in the presence of Reinaldo Meeker, MD.   Reinaldo Meeker, MD [place] [phone]

## 2022-06-18 ENCOUNTER — Other Ambulatory Visit: Payer: Self-pay | Admitting: Cardiology

## 2022-06-18 ENCOUNTER — Encounter: Payer: Self-pay | Admitting: Cardiology

## 2022-06-18 ENCOUNTER — Ambulatory Visit: Payer: BLUE CROSS/BLUE SHIELD | Attending: Cardiology | Admitting: Cardiology

## 2022-06-18 VITALS — BP 140/82 | HR 93 | Ht 70.0 in | Wt 398.2 lb

## 2022-06-18 DIAGNOSIS — R0609 Other forms of dyspnea: Secondary | ICD-10-CM | POA: Diagnosis not present

## 2022-06-18 DIAGNOSIS — G4733 Obstructive sleep apnea (adult) (pediatric): Secondary | ICD-10-CM | POA: Diagnosis not present

## 2022-06-18 DIAGNOSIS — I5032 Chronic diastolic (congestive) heart failure: Secondary | ICD-10-CM | POA: Diagnosis not present

## 2022-06-18 DIAGNOSIS — R7303 Prediabetes: Secondary | ICD-10-CM

## 2022-06-18 DIAGNOSIS — R609 Edema, unspecified: Secondary | ICD-10-CM

## 2022-06-18 DIAGNOSIS — I83029 Varicose veins of left lower extremity with ulcer of unspecified site: Secondary | ICD-10-CM

## 2022-06-18 DIAGNOSIS — R600 Localized edema: Secondary | ICD-10-CM

## 2022-06-18 DIAGNOSIS — I5030 Unspecified diastolic (congestive) heart failure: Secondary | ICD-10-CM | POA: Insufficient documentation

## 2022-06-18 DIAGNOSIS — Z6841 Body Mass Index (BMI) 40.0 and over, adult: Secondary | ICD-10-CM

## 2022-06-18 DIAGNOSIS — L97929 Non-pressure chronic ulcer of unspecified part of left lower leg with unspecified severity: Secondary | ICD-10-CM

## 2022-06-18 DIAGNOSIS — E785 Hyperlipidemia, unspecified: Secondary | ICD-10-CM

## 2022-06-18 MED ORDER — ATORVASTATIN CALCIUM 10 MG PO TABS: 10.0000 mg | ORAL_TABLET | Freq: Every day | ORAL | 3 refills | Status: DC

## 2022-06-18 MED ORDER — ASPIRIN 81 MG PO TBEC: 81.0000 mg | DELAYED_RELEASE_TABLET | Freq: Every day | ORAL | 3 refills | Status: AC

## 2022-06-18 NOTE — Addendum Note (Signed)
Addended by: Baldo Ash D on: 06/18/2022 03:22 PM   Modules accepted: Orders

## 2022-06-18 NOTE — Progress Notes (Signed)
Cardiology Consultation:    Date:  06/18/2022   ID:  Madelyn Brunner, DOB 11/01/1960, MRN [id number]  PCP:  Abigail Miyamoto, MD  Cardiologist:  Gypsy Balsam, MD   Referring MD: Abigail Miyamoto,*   Chief Complaint  Patient presents with   Shortness of Breath   Circulatory Problem    Lower extremities    fluid retention    Lasix helps     History of Present Illness:    TANIA PERROTT is a 61 y.o. male who is being seen today for the evaluation of lower extremity edema at the request of Abigail Miyamoto,*.  Past medical history significant for essential hypertension, borderline diabetes, obstructive sleep apnea use BiPAP mask on the regular basis he was referred to [place] because of chronic swelling of lower extremities as well as venous ulcer in the left leg.  He said his leg being swollen for a long period of time but for the first time he developed a wound that is difficult to heal he does have bandages today wrapping that lesion therefore we did not have a chance to look at it.  It is not painful.  He denies have any cardiac complaints but he does have shortness of breath fatigue tiredness.  There is no chest pain tightness squeezing pressure burning chest.  He quit smoking few years ago, does not have family history of premature coronary artery disease but his father did have some heart problem but again not premature.  He does not exercise on the regular basis he is not on any special diet  Past Medical History:  Diagnosis Date   Morbid obesity due to excess calories (HCC)    Obstructive sleep apnea (adult) (pediatric)     History reviewed. No pertinent surgical history.  Current Medications: Current Meds  Medication Sig   albuterol (VENTOLIN HFA) 108 (90 Base) MCG/ACT inhaler Inhale 2 puffs into the lungs every 6 (six) hours as needed for wheezing or shortness of breath.   AMERICAN GINSENG PO Take 500 mg by mouth daily.   BREO ELLIPTA 100-25 MCG/ACT AEPB  TAKE 1 INHALATION ONCE A DAY (Patient taking differently: Inhale 1 puff into the lungs daily.)   clindamycin (CLEOCIN) 150 MG capsule Take 1 capsule (150 mg total) by mouth 3 (three) times daily.   enalapril (VASOTEC) 10 MG tablet Take 1 tablet (10 mg total) by mouth daily.   furosemide (LASIX) 40 MG tablet Take 1 tablet (40 mg total) by mouth 2 (two) times daily.   lidocaine (LIDODERM) 5 % Place 1 patch onto the skin daily.   Potassium Gluconate 2.5 MEQ TABS Take 1 tablet by mouth daily.   [DISCONTINUED] gabapentin (NEURONTIN) 300 MG capsule Take 300 mg by mouth 2 (two) times daily.   [DISCONTINUED] montelukast (SINGULAIR) 10 MG tablet Take 10 mg by mouth daily.   [DISCONTINUED] Semaglutide-Weight Management 1 MG/0.5ML SOAJ Inject 1 mg into the skin once a week.   [DISCONTINUED] valACYclovir (VALTREX) 1000 MG tablet Take 1,000 mg by mouth 3 (three) times daily.     Allergies:   Patient has no known allergies.   Social History   Socioeconomic History   Marital status: Divorced    Spouse name: Not on file   Number of children: 1   Years of education: Not on file   Highest education level: Not on file  Occupational History   Not on file  Tobacco Use   Smoking status: Never   Smokeless tobacco: Never  Vaping Use   Vaping Use: Never used  Substance and Sexual Activity   Alcohol use: Not Currently   Drug use: Not Currently   Sexual activity: Not on file  Other Topics Concern   Not on file  Social History Narrative   Not on file   Social Determinants of Health   Financial Resource Strain: Not on file  Food Insecurity: Not on file  Transportation Needs: Not on file  Physical Activity: Not on file  Stress: Not on file  Social Connections: Not on file     Family History: The patient's family history includes Diabetes in his father; Heart attack in his father; Hypertension in his father; Transient ischemic attack in his mother. ROS:   Please see the history of present  illness.    All 14 point review of systems negative except as described per history of present illness.  EKGs/Labs/Other Studies Reviewed:    The following studies were reviewed today:   EKG:  EKG is  ordered today.  The ekg ordered today demonstrates normal sinus rhythm normal P interval right bundle branch block left anterior fascicular block  Recent Labs: 05/28/2022: ALT 20; BUN 13; Creatinine, Ser 0.95; Hemoglobin 11.8; NT-Pro BNP 922; Platelets 367; Potassium 4.6; Sodium 142  Recent Lipid Panel    Component Value Date/Time   CHOL 158 01/01/2022 0920   TRIG 83 01/01/2022 0920   HDL 37 (L) 01/01/2022 0920   CHOLHDL 4.3 01/01/2022 0920   LDLCALC 105 (H) 01/01/2022 0920    Physical Exam:    VS:  BP (!) 140/82 (BP Location: Left Arm, Patient Position: Sitting)   Pulse 93   Ht 5\' 10"  (1.778 m)   Wt (!) 398 lb 3.2 oz (180.6 kg)   SpO2 97%   BMI 57.14 kg/m     Wt Readings from Last 3 Encounters:  06/18/22 (!) 398 lb 3.2 oz (180.6 kg)  06/11/22 (!) 406 lb (184.2 kg)  06/04/22 (!) 414 lb (187.8 kg)     GEN:  Well nourished, well developed in no acute distress HEENT: Normal NECK: No JVD; No carotid bruits LYMPHATICS: No lymphadenopathy CARDIAC: RRR, no murmurs, no rubs, no gallops RESPIRATORY:  Clear to auscultation without rales, wheezing or rhonchi  ABDOMEN: Soft, non-tender, non-distended MUSCULOSKELETAL:  No edema; No deformity  SKIN: Warm and dry NEUROLOGIC:  Alert and oriented x 3 PSYCHIATRIC:  Normal affect   ASSESSMENT:    1. Chronic diastolic congestive heart failure (HCC)   2. Ulcerated leg varices, left (HCC)   3. Obstructive sleep apnea (adult) (pediatric)   4. Prediabetes   5. Peripheral edema   6. BMI 50.0-59.9, adult (HCC)    PLAN:    In order of problems listed above:  Chronic swelling of lower extremities which I think is multifactorial.  There is some component of pitting edema as well as lymphedema I think he may have diastolic congestive  heart failure we will ask him to have proBNP as well as Chem-7 done today, we will do echocardiogram to assess left ventricle ejection fraction more importantly we will look at the right ventricle size and function since he does have obstructive sleep apnea.  He is being managed with diuretics.  Chem-7 will let me know if I can increase his diuresis.  However there is no question in my mind that partial reason for his swelling is also morbid obesity which is compressing his groins.  That is probably what responsible for lymphedema. Ulceration of his leg.  Probably venous ulcer painless, that being followed by internal medicine team. Essential hypertension, blood pressure slightly on the higher side but this is first visit in my office we will continue monitoring it. Prediabetes last hemoglobin A1c 6.3.  I think he is diabetic.  I advised him to start taking baby aspirin every single day.  Because of presence of diabetes I will also initiate statin.  I did review K PN which show me his LDL 105 HDL 37.  But because he is diabetic he need to be on moderate intense statin.  I will start Lipitor 10 daily. Morbid obesity obviously huge problem would be ideal for him to lose some weight that be tremendously beneficial.  In the future we will talk about potentially gastric bypass surgery. Obstructive sleep apnea that being follow-up by pulmonologist here in town.  Apparently stable   Medication Adjustments/Labs and Tests Ordered: Current medicines are reviewed at length with the patient today.  Concerns regarding medicines are outlined above.  No orders of the defined types were placed in this encounter.  No orders of the defined types were placed in this encounter.   Signed, Georgeanna Lea, MD, [facility]. 06/18/2022 3:05 PM    [facility]

## 2022-06-18 NOTE — Patient Instructions (Addendum)
Medication Instructions:  Your physician has recommended you make the following change in your medication:   START: Enteric Coated Aspirin 81mg  1 tablet daily  START: Lipitor 10mg  1 tablet daily    Lab Work: Pro BNP, BMP - today If you have labs (blood work) drawn today and your tests are completely normal, you will receive your results only by: MyChart Message (if you have MyChart) OR A paper copy in the mail If you have any lab test that is abnormal or we need to change your treatment, we will call you to review the results.  Your physician recommends that you return for lab work in: 6 weeks You need to have labs done when you are fasting.  You can come Monday through Friday 8:30 am to 12:00 pm and 1:15 to 4:30. You do not need to make an appointment as the order has already been placed. The labs you are going to have done are AST, ALT, Lipids.    Testing/Procedures: Your physician has requested that you have an echocardiogram. Echocardiography is a painless test that uses sound waves to create images of your heart. It provides your doctor with information about the size and shape of your heart and how well your heart's chambers and valves are working. This procedure takes approximately one hour. There are no restrictions for this procedure. Please do NOT wear cologne, perfume, aftershave, or lotions (deodorant is allowed). Please arrive 15 minutes prior to your appointment time.    Follow-Up: At [facility], you and your health needs are our priority.  As part of our continuing mission to provide you with exceptional heart care, we have created designated Provider Care Teams.  These Care Teams include your primary Cardiologist (physician) and Advanced Practice Providers (APPs -  Physician Assistants and Nurse Practitioners) who all work together to provide you with the care you need, when you need it.  We recommend signing up for the patient portal called "MyChart".  Sign up  information is provided on this After Visit Summary.  MyChart is used to connect with patients for Virtual Visits (Telemedicine).  Patients are able to view lab/test results, encounter notes, upcoming appointments, etc.  Non-urgent messages can be sent to your provider as well.   To learn more about what you can do with MyChart, go to Thursday.    Your next appointment:   3 month(s)  The format for your next appointment:   In Person  Provider:   [facility], MD    Other Instructions NA

## 2022-06-19 ENCOUNTER — Ambulatory Visit: Payer: BLUE CROSS/BLUE SHIELD | Admitting: Legal Medicine

## 2022-06-19 DIAGNOSIS — L97929 Non-pressure chronic ulcer of unspecified part of left lower leg with unspecified severity: Secondary | ICD-10-CM

## 2022-06-19 DIAGNOSIS — I83029 Varicose veins of left lower extremity with ulcer of unspecified site: Secondary | ICD-10-CM

## 2022-06-19 LAB — BASIC METABOLIC PANEL
BUN/Creatinine Ratio: 19 (ref 10–24)
BUN: 21 mg/dL (ref 8–27)
CO2: 28 mmol/L (ref 20–29)
Calcium: 9.5 mg/dL (ref 8.6–10.2)
Chloride: 101 mmol/L (ref 96–106)
Creatinine, Ser: 1.1 mg/dL (ref 0.76–1.27)
Glucose: 94 mg/dL (ref 70–99)
Potassium: 4.6 mmol/L (ref 3.5–5.2)
Sodium: 143 mmol/L (ref 134–144)
eGFR: 77 mL/min/{1.73_m2} (ref >59)

## 2022-06-19 NOTE — Progress Notes (Signed)
Subjective:  Patient ID: Victor Hamilton, male    DOB: [birth year]  Age: 61 y.o. MRN: [id number]  Chief Complaint  Patient presents with   Left leg recheck    HPI: The leg left remains swollen but large ulcer from 6 cm to 4 cm shrunk in size. The anterior 1cm ulcer is clean.  Patient on antibiotics for staph aureus in large ulcer. Continue wrapping.   Current Outpatient Medications on File Prior to Visit  Medication Sig Dispense Refill   albuterol (VENTOLIN HFA) 108 (90 Base) MCG/ACT inhaler Inhale 2 puffs into the lungs every 6 (six) hours as needed for wheezing or shortness of breath. 8 g 6   AMERICAN GINSENG PO Take 500 mg by mouth daily.     aspirin EC 81 MG tablet Take 1 tablet (81 mg total) by mouth daily. Swallow whole. 90 tablet 3   atorvastatin (LIPITOR) 10 MG tablet Take 1 tablet (10 mg total) by mouth daily. 90 tablet 3   BREO ELLIPTA 100-25 MCG/ACT AEPB TAKE 1 INHALATION ONCE A DAY (Patient taking differently: Inhale 1 puff into the lungs daily.) 180 each 1   clindamycin (CLEOCIN) 150 MG capsule Take 1 capsule (150 mg total) by mouth 3 (three) times daily. 30 capsule 1   enalapril (VASOTEC) 10 MG tablet Take 1 tablet (10 mg total) by mouth daily. 90 tablet 2   furosemide (LASIX) 40 MG tablet Take 1 tablet (40 mg total) by mouth 2 (two) times daily. 60 tablet 3   lidocaine (LIDODERM) 5 % Place 1 patch onto the skin daily.     Potassium Gluconate 2.5 MEQ TABS Take 1 tablet by mouth daily.     No current facility-administered medications on file prior to visit.   Past Medical History:  Diagnosis Date   Morbid obesity due to excess calories ([place])    Obstructive sleep apnea (adult) (pediatric)    No past surgical history on file.  Family History  Problem Relation Age of Onset   Transient ischemic attack Mother    Hypertension Father    Heart attack Father    Diabetes Father    Social History   Socioeconomic History   Marital status: Divorced    Spouse name: Not on file    Number of children: 1   Years of education: Not on file   Highest education level: Not on file  Occupational History   Not on file  Tobacco Use   Smoking status: Never   Smokeless tobacco: Never  Vaping Use   Vaping Use: Never used  Substance and Sexual Activity   Alcohol use: Not Currently   Drug use: Not Currently   Sexual activity: Not on file  Other Topics Concern   Not on file  Social History Narrative   Not on file   Social Determinants of Health   Financial Resource Strain: Not on file  Food Insecurity: Not on file  Transportation Needs: Not on file  Physical Activity: Not on file  Stress: Not on file  Social Connections: Not on file    Review of Systems  Constitutional:  Negative for appetite change, fatigue and fever.  HENT:  Negative for congestion, ear pain, sinus pressure and sore throat.   Eyes: Negative.   Respiratory:  Negative for cough, chest tightness, shortness of breath and wheezing.   Cardiovascular:  Negative for chest pain and palpitations.  Gastrointestinal:  Negative for abdominal pain, constipation, diarrhea, nausea and vomiting.  Genitourinary:  Negative for dysuria  and hematuria.  Musculoskeletal:  Negative for arthralgias, back pain, joint swelling and myalgias.  Skin:  Positive for wound (Ulcerated Left leg). Negative for rash.  Neurological:  Negative for dizziness, weakness and headaches.  Psychiatric/Behavioral:  Negative for dysphoric mood. The patient is not nervous/anxious.      Objective:  There were no vitals taken for this visit.     06/18/2022    2:47 PM 06/11/2022    1:33 PM 06/04/2022    3:32 PM  BP/Weight  Systolic BP [id number]  Diastolic BP 82 72 90  Wt. (Lbs) 398.2 406 414  BMI 57.14 kg/m2 58.25 kg/m2 59.4 kg/m2    Physical Exam Vitals reviewed.  Constitutional:      Appearance: Normal appearance. He is normal weight.  HENT:     Head: Normocephalic.     Right Ear: Tympanic membrane normal.     Left  Ear: Tympanic membrane normal.     Nose: Nose normal.     Mouth/Throat:     Mouth: Mucous membranes are moist.     Pharynx: Oropharynx is clear.  Eyes:     Extraocular Movements: Extraocular movements intact.     Conjunctiva/sclera: Conjunctivae normal.     Pupils: Pupils are equal, round, and reactive to light.  Cardiovascular:     Rate and Rhythm: Normal rate and regular rhythm.     Pulses: Normal pulses.     Heart sounds: Normal heart sounds. No murmur heard.    No gallop.  Pulmonary:     Effort: Pulmonary effort is normal. No respiratory distress.     Breath sounds: Normal breath sounds. No wheezing.  Abdominal:     General: Abdomen is flat. Bowel sounds are normal. There is no distension.     Palpations: Abdomen is soft.     Tenderness: There is no abdominal tenderness.  Musculoskeletal:     Right lower leg: Edema present.     Left lower leg: Edema present.  Skin:    General: Skin is warm.     Capillary Refill: Capillary refill takes less than 2 seconds.     Comments: 6 cm ulcer lateral leg has reduced to 4cm and is shallow, the 1cm anterior ulcer is unchanged  Neurological:     General: No focal deficit present.     Mental Status: He is alert and oriented to person, place, and time. Mental status is at baseline.     Gait: Gait normal.     Deep Tendon Reflexes: Reflexes normal.  Psychiatric:        Mood and Affect: Mood normal.        Behavior: Behavior normal.         Lab Results  Component Value Date   WBC 9.6 05/28/2022   HGB 11.8 (L) 05/28/2022   HCT 39.0 05/28/2022   PLT 367 05/28/2022   GLUCOSE 98 05/28/2022   CHOL 158 01/01/2022   TRIG 83 01/01/2022   HDL 37 (L) 01/01/2022   LDLCALC 105 (H) 01/01/2022   ALT 20 05/28/2022   AST 20 05/28/2022   NA 142 05/28/2022   K 4.6 05/28/2022   CL 101 05/28/2022   CREATININE 0.95 05/28/2022   BUN 13 05/28/2022   CO2 29 05/28/2022   HGBA1C 6.3 (H) 05/28/2022      Assessment & Plan:   Problem List Items  Addressed This Visit       Cardiovascular and Mediastinum   Ulcerated leg varices, left ([place]) - Primary The  ulcers are healing well with compression and antibiotics.  Rewrap with unna boots  .       Follow-up: No follow-ups on file.  An After Visit Summary was printed and given to the patient.    I,Nilah Belcourt M Jane Broughton,acting as a scribe for Brent Bulla, MD.,have documented all relevant documentation on the behalf of Brent Bulla, MD,as directed by  Brent Bulla, MD while in the presence of Brent Bulla, MD.   Brent Bulla, MD [facility] [phone]

## 2022-06-19 NOTE — Progress Notes (Signed)
   Established Patient Office Visit  Subjective   Patient ID: Victor Hamilton, male    DOB: 28-Dec-1960  Age: 61 y.o. MRN: [id number]  Chief Complaint  Patient presents with   Left leg recheck    HPI: the left leg is healing slowly, the anterior ulcer is about 1/2 cm and the lateral ulcer is wet and 3 by 4 cm.  No evidence for furtherncellulitis.  Area cleane3d and dressee with silvadene. FLA forms completed.   Review of Systems  Constitutional:  Negative for chills and fever.  HENT:  Negative for congestion and hearing loss.   Eyes:  Negative for redness.  Respiratory:  Negative for cough.   Cardiovascular:  Negative for palpitations.  Gastrointestinal: Negative.   Genitourinary: Negative.   Musculoskeletal: Negative.   Neurological: Negative.   Psychiatric/Behavioral: Negative.        Objective:     There were no vitals taken for this visit.   Physical Exam Vitals reviewed.  Constitutional:      Appearance: Normal appearance. He is obese.  HENT:     Head: Normocephalic and atraumatic.     Right Ear: Tympanic membrane normal.     Left Ear: Tympanic membrane normal.     Nose: Nose normal.     Mouth/Throat:     Mouth: Mucous membranes are moist.     Pharynx: Oropharynx is clear.  Eyes:     Extraocular Movements: Extraocular movements intact.     Conjunctiva/sclera: Conjunctivae normal.     Pupils: Pupils are equal, round, and reactive to light.  Cardiovascular:     Rate and Rhythm: Normal rate.     Pulses: Normal pulses.     Heart sounds: No murmur heard.    No gallop.  Pulmonary:     Effort: Pulmonary effort is normal. No respiratory distress.     Breath sounds: No wheezing.  Abdominal:     General: Abdomen is flat. Bowel sounds are normal. There is no distension.     Tenderness: There is no abdominal tenderness.  Musculoskeletal:        General: Normal range of motion.     Cervical back: Normal range of motion.  Skin:    Capillary Refill: Capillary refill  takes less than 2 seconds.     Findings: Lesion present.          Comments: Small ulcer healing well anterior left lower leg and 3x5 cm ulcer stage 2 that is weeping lateral aspect of thigh.  Neurological:     General: No focal deficit present.     Mental Status: He is alert and oriented to person, place, and time. Mental status is at baseline.          The 10-year ASCVD risk score (Arnett DK, et al., 2019) is: 11.7%    Assessment & Plan:   Diagnoses and all orders for this visit: Ulcerated leg varices, left (HCC) The ulcers are healing well, redress with unna boot, start seeing him Tuesdays and nurse check and rewrap on fridays     Return tuesday and friday q-week.    Brent Bulla, MD

## 2022-06-21 ENCOUNTER — Encounter: Payer: Self-pay | Admitting: Legal Medicine

## 2022-06-25 ENCOUNTER — Encounter: Payer: Self-pay | Admitting: Legal Medicine

## 2022-06-25 ENCOUNTER — Ambulatory Visit (INDEPENDENT_AMBULATORY_CARE_PROVIDER_SITE_OTHER): Payer: BLUE CROSS/BLUE SHIELD | Admitting: Legal Medicine

## 2022-06-25 ENCOUNTER — Telehealth: Payer: Self-pay

## 2022-06-25 VITALS — BP 140/70 | HR 91 | Temp 97.3°F | Resp 18 | Ht 70.0 in | Wt 399.0 lb

## 2022-06-25 DIAGNOSIS — I10 Essential (primary) hypertension: Secondary | ICD-10-CM

## 2022-06-25 DIAGNOSIS — I83029 Varicose veins of left lower extremity with ulcer of unspecified site: Secondary | ICD-10-CM | POA: Diagnosis not present

## 2022-06-25 DIAGNOSIS — L97929 Non-pressure chronic ulcer of unspecified part of left lower leg with unspecified severity: Secondary | ICD-10-CM

## 2022-06-25 MED ORDER — FUROSEMIDE 40 MG PO TABS: 60.0000 mg | ORAL_TABLET | Freq: Two times a day (BID) | ORAL | 11 refills | Status: DC

## 2022-06-25 NOTE — Telephone Encounter (Signed)
-----   Message from Georgeanna Lea, MD sent at 06/24/2022 10:02 AM EST ----- Lets increase Lasix to 60 mg twice daily, he did have a Chem-7 done within the next week

## 2022-06-25 NOTE — Progress Notes (Unsigned)
Subjective:  Patient ID: Victor Hamilton, male    DOB: March 29, 1961  Age: 61 y.o. MRN: [id number]  Chief Complaint  Patient presents with   Follow-up    HPI Patient states ulcer is healing and doing better. No drainage.  No infection.  The anterior wound is healed, the lateral wound is superficial and 3 x 4 cm.   Current Outpatient Medications on File Prior to Visit  Medication Sig Dispense Refill   albuterol (VENTOLIN HFA) 108 (90 Base) MCG/ACT inhaler Inhale 2 puffs into the lungs every 6 (six) hours as needed for wheezing or shortness of breath. 8 g 6   AMERICAN GINSENG PO Take 500 mg by mouth daily.     aspirin EC 81 MG tablet Take 1 tablet (81 mg total) by mouth daily. Swallow whole. 90 tablet 3   atorvastatin (LIPITOR) 10 MG tablet Take 1 tablet (10 mg total) by mouth daily. 90 tablet 3   BREO ELLIPTA 100-25 MCG/ACT AEPB TAKE 1 INHALATION ONCE A DAY (Patient taking differently: Inhale 1 puff into the lungs daily.) 180 each 1   clindamycin (CLEOCIN) 150 MG capsule Take 1 capsule (150 mg total) by mouth 3 (three) times daily. 30 capsule 1   enalapril (VASOTEC) 10 MG tablet Take 1 tablet (10 mg total) by mouth daily. 90 tablet 2   lidocaine (LIDODERM) 5 % Place 1 patch onto the skin daily.     Potassium Gluconate 2.5 MEQ TABS Take 1 tablet by mouth daily.     No current facility-administered medications on file prior to visit.   Past Medical History:  Diagnosis Date   Morbid obesity due to excess calories (HCC)    Obstructive sleep apnea (adult) (pediatric)    History reviewed. No pertinent surgical history.  Family History  Problem Relation Age of Onset   Transient ischemic attack Mother    Hypertension Father    Heart attack Father    Diabetes Father    Social History   Socioeconomic History   Marital status: Divorced    Spouse name: Not on file   Number of children: 1   Years of education: Not on file   Highest education level: Not on file  Occupational History    Not on file  Tobacco Use   Smoking status: Never   Smokeless tobacco: Never  Vaping Use   Vaping Use: Never used  Substance and Sexual Activity   Alcohol use: Not Currently   Drug use: Not Currently   Sexual activity: Not on file  Other Topics Concern   Not on file  Social History Narrative   Not on file   Social Determinants of Health   Financial Resource Strain: Not on file  Food Insecurity: Not on file  Transportation Needs: Not on file  Physical Activity: Not on file  Stress: Not on file  Social Connections: Not on file    Review of Systems  Constitutional:  Negative for chills, fatigue and fever.  HENT:  Negative for congestion, ear pain and sore throat.   Eyes:  Negative for visual disturbance.  Respiratory:  Negative for cough and shortness of breath.   Gastrointestinal:  Negative for abdominal pain, constipation, diarrhea, nausea and vomiting.  Endocrine: Negative for polydipsia, polyphagia and polyuria.  Genitourinary:  Negative for dysuria, frequency and urgency.  Musculoskeletal:  Negative for arthralgias and back pain.  Neurological:  Negative for dizziness and headaches.  Psychiatric/Behavioral:  Negative for dysphoric mood. The patient is not nervous/anxious.  Objective:  BP (!) 140/70   Pulse 91   Temp (!) 97.3 F (36.3 C)   Resp 18   Ht 5\' 10"  (1.778 m)   Wt (!) 399 lb (181 kg)   SpO2 95%   BMI 57.25 kg/m      06/25/2022    2:18 PM 06/18/2022    2:47 PM 06/11/2022    1:33 PM  BP/Weight  Systolic BP 140 140 128  Diastolic BP 70 82 72  Wt. (Lbs) 399 398.2 406  BMI 57.25 kg/m2 57.14 kg/m2 58.25 kg/m2    Physical Exam Vitals reviewed.  Constitutional:      General: He is not in acute distress.    Appearance: Normal appearance.  HENT:     Head: Normocephalic.     Right Ear: Tympanic membrane normal.     Left Ear: Tympanic membrane normal.     Nose: Nose normal.     Mouth/Throat:     Mouth: Mucous membranes are moist.  Eyes:      Extraocular Movements: Extraocular movements intact.     Conjunctiva/sclera: Conjunctivae normal.     Pupils: Pupils are equal, round, and reactive to light.  Cardiovascular:     Rate and Rhythm: Normal rate.     Pulses: Normal pulses.     Heart sounds: No murmur heard.    No gallop.  Pulmonary:     Effort: Pulmonary effort is normal. No respiratory distress.     Breath sounds: Normal breath sounds. No wheezing.  Abdominal:     General: Abdomen is flat. Bowel sounds are normal. There is no distension.     Tenderness: There is no abdominal tenderness.  Musculoskeletal:        General: Normal range of motion.     Cervical back: Normal range of motion.  Skin:    Findings: Lesion present.     Comments: Healing stasis ulcer left leg  Neurological:     General: No focal deficit present.     Mental Status: Mental status is at baseline.         Lab Results  Component Value Date   WBC 9.6 05/28/2022   HGB 11.8 (L) 05/28/2022   HCT 39.0 05/28/2022   PLT 367 05/28/2022   GLUCOSE 94 06/18/2022   CHOL 158 01/01/2022   TRIG 83 01/01/2022   HDL 37 (L) 01/01/2022   LDLCALC 105 (H) 01/01/2022   ALT 20 05/28/2022   AST 20 05/28/2022   NA 143 06/18/2022   K 4.6 06/18/2022   CL 101 06/18/2022   CREATININE 1.10 06/18/2022   BUN 21 06/18/2022   CO2 28 06/18/2022   HGBA1C 6.3 (H) 05/28/2022      Assessment & Plan:   Problem List Items Addressed This Visit       Cardiovascular and Mediastinum   Ulcerated leg varices, left (HCC) - Primary Patient is taking care of his left leg ulceration with no drainage change dressing and anterior ulcer is healed and the lateral ulcer has decreased and there is no evidence for infection.  I agree redressed ulcer and will see back again in 3 days with nurse to change dressing then I will follow him up in 1 week.  .       Follow-up: Return in about 3 days (around 06/28/2022), or nurse visit for rewrap , follow up with doctor one week.  An  After Visit Summary was printed and given to the patient.  14/02/2022, MD [facility] (  [phone] 

## 2022-06-26 ENCOUNTER — Telehealth: Payer: Self-pay

## 2022-06-26 ENCOUNTER — Other Ambulatory Visit: Payer: Self-pay

## 2022-06-26 NOTE — Telephone Encounter (Signed)
LVM to call regarding lab results. Message also left of My Chart. Encouraged to call with any question or concerns.

## 2022-06-26 NOTE — Telephone Encounter (Signed)
Results reviewed with pt as per Dr. Krasowski's note.  Pt verbalized understanding and had no additional questions. Routed to PCP  

## 2022-06-28 ENCOUNTER — Ambulatory Visit: Payer: BLUE CROSS/BLUE SHIELD

## 2022-06-28 NOTE — Progress Notes (Signed)
Patient here to have left leg wrapped per Dr Marina Goodell Left leg looks great patient told to follow up with Dr Marina Goodell on Tuesday.

## 2022-07-01 NOTE — Progress Notes (Signed)
Yes leg was rewrapped.

## 2022-07-02 ENCOUNTER — Encounter: Payer: Self-pay | Admitting: Legal Medicine

## 2022-07-02 ENCOUNTER — Ambulatory Visit: Payer: BLUE CROSS/BLUE SHIELD | Admitting: Legal Medicine

## 2022-07-02 VITALS — BP 136/78 | HR 9 | Temp 97.1°F | Ht 70.0 in | Wt 390.0 lb

## 2022-07-02 DIAGNOSIS — L97929 Non-pressure chronic ulcer of unspecified part of left lower leg with unspecified severity: Secondary | ICD-10-CM | POA: Diagnosis not present

## 2022-07-02 DIAGNOSIS — I83029 Varicose veins of left lower extremity with ulcer of unspecified site: Secondary | ICD-10-CM | POA: Diagnosis not present

## 2022-07-02 NOTE — Progress Notes (Signed)
Subjective:  Patient ID: Victor Hamilton, male    DOB: [birth year]  Age: 61 y.o. MRN: [id number]  Chief Complaint  Patient presents with   Leg Swelling    Follow up    HPI Patient here for two 4 dayfollow up for left leg. Redressed by nurse. Patient states ulcer is healing and doing better. No drainage.  No infection.  The anterior wound is healed, the lateral wound is superficial and 3 x 3 cm. Get smaller and superficial. Current Outpatient Medications on File Prior to Visit  Medication Sig Dispense Refill   albuterol (VENTOLIN HFA) 108 (90 Base) MCG/ACT inhaler Inhale 2 puffs into the lungs every 6 (six) hours as needed for wheezing or shortness of breath. 8 g 6   AMERICAN GINSENG PO Take 500 mg by mouth daily.     aspirin EC 81 MG tablet Take 1 tablet (81 mg total) by mouth daily. Swallow whole. 90 tablet 3   atorvastatin (LIPITOR) 10 MG tablet Take 1 tablet (10 mg total) by mouth daily. 90 tablet 3   BREO ELLIPTA 100-25 MCG/ACT AEPB TAKE 1 INHALATION ONCE A DAY (Patient taking differently: Inhale 1 puff into the lungs daily.) 180 each 1   clindamycin (CLEOCIN) 150 MG capsule Take 1 capsule (150 mg total) by mouth 3 (three) times daily. 30 capsule 1   enalapril (VASOTEC) 10 MG tablet Take 1 tablet (10 mg total) by mouth daily. 90 tablet 2   furosemide (LASIX) 40 MG tablet Take 1.5 tablets (60 mg total) by mouth 2 (two) times daily. 90 tablet 11   lidocaine (LIDODERM) 5 % Place 1 patch onto the skin daily.     Potassium Gluconate 2.5 MEQ TABS Take 1 tablet by mouth daily.     No current facility-administered medications on file prior to visit.   Past Medical History:  Diagnosis Date   Morbid obesity due to excess calories (HCC)    Obstructive sleep apnea (adult) (pediatric)    History reviewed. No pertinent surgical history.  Family History  Problem Relation Age of Onset   Transient ischemic attack Mother    Hypertension Father    Heart attack Father    Diabetes Father     Social History   Socioeconomic History   Marital status: Divorced    Spouse name: Not on file   Number of children: 1   Years of education: Not on file   Highest education level: Not on file  Occupational History   Not on file  Tobacco Use   Smoking status: Never   Smokeless tobacco: Never  Vaping Use   Vaping Use: Never used  Substance and Sexual Activity   Alcohol use: Not Currently   Drug use: Not Currently   Sexual activity: Not on file  Other Topics Concern   Not on file  Social History Narrative   Not on file   Social Determinants of Health   Financial Resource Strain: Not on file  Food Insecurity: Not on file  Transportation Needs: Not on file  Physical Activity: Not on file  Stress: Not on file  Social Connections: Not on file    Review of Systems  Constitutional:  Negative for fatigue.  HENT:  Negative for congestion, ear pain and sore throat.   Eyes:  Negative for visual disturbance.  Respiratory:  Negative for cough and shortness of breath.   Cardiovascular:  Negative for chest pain.  Gastrointestinal:  Negative for abdominal pain, constipation, diarrhea, nausea and vomiting.  Genitourinary:  Negative for dysuria, frequency and urgency.  Musculoskeletal:  Negative for arthralgias, back pain and myalgias.  Neurological:  Negative for dizziness and headaches.  Psychiatric/Behavioral:  Negative for agitation and sleep disturbance. The patient is not nervous/anxious.      Objective:  BP 136/78 (BP Location: Right Arm, Patient Position: Sitting, Cuff Size: Large)   Pulse (!) 9   Temp (!) 97.1 F (36.2 C) (Temporal)   Ht 5\' 10"  (1.778 m)   Wt (!) 390 lb (176.9 kg)   SpO2 95%   BMI 55.96 kg/m      07/02/2022    1:27 PM 06/25/2022    2:18 PM 06/18/2022    2:47 PM  BP/Weight  Systolic BP 136 140 140  Diastolic BP 78 70 82  Wt. (Lbs) 390 399 398.2  BMI 55.96 kg/m2 57.25 kg/m2 57.14 kg/m2    Physical Exam Vitals reviewed.  Constitutional:       General: He is not in acute distress.    Appearance: Normal appearance. He is obese.  HENT:     Head: Normocephalic.     Right Ear: Tympanic membrane normal.     Left Ear: Tympanic membrane normal.  Neck:     Vascular: No carotid bruit.  Cardiovascular:     Rate and Rhythm: Normal rate and regular rhythm.     Pulses: Normal pulses.     Heart sounds: Normal heart sounds. No murmur heard.    No gallop.  Pulmonary:     Effort: Pulmonary effort is normal.     Breath sounds: Normal breath sounds.  Abdominal:     General: Bowel sounds are normal.     Palpations: Abdomen is soft.     Tenderness: There is no abdominal tenderness.  Musculoskeletal:        General: Normal range of motion.     Cervical back: Normal range of motion.  Skin:    General: Skin is warm.     Capillary Refill: Capillary refill takes less than 2 seconds.     Comments: Superficial ulcer 3x3 cm left leg, healing  Neurological:     General: No focal deficit present.     Mental Status: He is alert and oriented to person, place, and time. Mental status is at baseline.  Psychiatric:        Mood and Affect: Mood normal.        Behavior: Behavior normal.         Lab Results  Component Value Date   WBC 9.6 05/28/2022   HGB 11.8 (L) 05/28/2022   HCT 39.0 05/28/2022   PLT 367 05/28/2022   GLUCOSE 94 06/18/2022   CHOL 158 01/01/2022   TRIG 83 01/01/2022   HDL 37 (L) 01/01/2022   LDLCALC 105 (H) 01/01/2022   ALT 20 05/28/2022   AST 20 05/28/2022   NA 143 06/18/2022   K 4.6 06/18/2022   CL 101 06/18/2022   CREATININE 1.10 06/18/2022   BUN 21 06/18/2022   CO2 28 06/18/2022   HGBA1C 6.3 (H) 05/28/2022      Assessment & Plan:   Problem List Items Addressed This Visit       Cardiovascular and Mediastinum   Ulcerated leg varices, left (HCC) - Primary The lateral ulcer continue to granulate in and is closing.  Continue therapy  .       Follow-up: Return in about 3 days (around 07/05/2022),  or follow up one week with me.  An After Visit Summary  was printed and given to the patient.  Reinaldo Meeker, MD [facility] [phone]

## 2022-07-03 ENCOUNTER — Ambulatory Visit: Payer: BLUE CROSS/BLUE SHIELD | Attending: Cardiology

## 2022-07-03 DIAGNOSIS — I5032 Chronic diastolic (congestive) heart failure: Secondary | ICD-10-CM | POA: Diagnosis not present

## 2022-07-03 DIAGNOSIS — R0609 Other forms of dyspnea: Secondary | ICD-10-CM | POA: Diagnosis not present

## 2022-07-03 LAB — ECHOCARDIOGRAM COMPLETE
Area-P 1/2: 5.27 cm2
S' Lateral: 5.4 cm

## 2022-07-03 MED ORDER — PERFLUTREN LIPID MICROSPHERE
1.0000 mL | INTRAVENOUS | Status: AC | PRN
  Administered 2022-07-03: 5 mL via INTRAVENOUS

## 2022-07-04 ENCOUNTER — Telehealth: Payer: Self-pay

## 2022-07-04 NOTE — Telephone Encounter (Signed)
-----   Message from Georgeanna Lea, MD sent at 07/04/2022  4:13 PM EST ----- Echocardiogram showed normal left ventricle ejection fraction, diastolic dysfunction but otherwise looks good

## 2022-07-04 NOTE — Telephone Encounter (Signed)
Patient notified of results.

## 2022-07-08 DIAGNOSIS — E1159 Type 2 diabetes mellitus with other circulatory complications: Secondary | ICD-10-CM | POA: Insufficient documentation

## 2022-07-08 DIAGNOSIS — I10 Essential (primary) hypertension: Secondary | ICD-10-CM | POA: Insufficient documentation

## 2022-07-08 NOTE — Progress Notes (Unsigned)
Subjective:  Patient ID: Victor Hamilton, male    DOB: [birth year]  Age: 61 y.o. MRN: [id number]  Chief Complaint  Patient presents with   Hypertension   Prediabetes    HPI   Patient presents for follow up of hypertension.  Patient tolerating enalapril 10 mg daily, Aspirin 81 mg daily well without side effects.  Patient is working on maintaining diet and exercise regimen and follows up as directed.   Patient presents with hyperlipidemia.  Compliance with treatment has been good; patient takes medicines as directed, maintains low cholesterol diet, follows up as directed, and maintains exercise regimen.  Patient is using Atorvastatin 10 mg daily without problems.   Asthma: Patient is using Breo Ellipta 1 inhale once a day, albuterol inhaler 2 puff every 6 hours prn.Some exacerbations of asthma PRN  Prediabetes: He is watching his diet and trying to eat low carbs diet.  BPH stable no medicines now. Current Outpatient Medications on File Prior to Visit  Medication Sig Dispense Refill   albuterol (VENTOLIN HFA) 108 (90 Base) MCG/ACT inhaler Inhale 2 puffs into the lungs every 6 (six) hours as needed for wheezing or shortness of breath. 8 g 6   AMERICAN GINSENG PO Take 500 mg by mouth daily.     aspirin EC 81 MG tablet Take 1 tablet (81 mg total) by mouth daily. Swallow whole. 90 tablet 3   atorvastatin (LIPITOR) 10 MG tablet Take 1 tablet (10 mg total) by mouth daily. 90 tablet 3   BREO ELLIPTA 100-25 MCG/ACT AEPB TAKE 1 INHALATION ONCE A DAY (Patient taking differently: Inhale 1 puff into the lungs daily.) 180 each 1   clindamycin (CLEOCIN) 150 MG capsule Take 1 capsule (150 mg total) by mouth 3 (three) times daily. 30 capsule 1   enalapril (VASOTEC) 10 MG tablet Take 1 tablet (10 mg total) by mouth daily. 90 tablet 2   furosemide (LASIX) 40 MG tablet Take 1.5 tablets (60 mg total) by mouth 2 (two) times daily. 90 tablet 11   lidocaine (LIDODERM) 5 % Place 1 patch onto the skin daily.      Potassium Gluconate 2.5 MEQ TABS Take 1 tablet by mouth daily.     No current facility-administered medications on file prior to visit.   Past Medical History:  Diagnosis Date   Morbid obesity due to excess calories (HCC)    Obstructive sleep apnea (adult) (pediatric)    No past surgical history on file.  Family History  Problem Relation Age of Onset   Transient ischemic attack Mother    Hypertension Father    Heart attack Father    Diabetes Father    Social History   Socioeconomic History   Marital status: Divorced    Spouse name: Not on file   Number of children: 1   Years of education: Not on file   Highest education level: Not on file  Occupational History   Not on file  Tobacco Use   Smoking status: Never   Smokeless tobacco: Never  Vaping Use   Vaping Use: Never used  Substance and Sexual Activity   Alcohol use: Not Currently   Drug use: Not Currently   Sexual activity: Not on file  Other Topics Concern   Not on file  Social History Narrative   Not on file   Social Determinants of Health   Financial Resource Strain: Not on file  Food Insecurity: Not on file  Transportation Needs: Not on file  Physical Activity: Not  on file  Stress: Not on file  Social Connections: Not on file    Review of Systems  Constitutional:  Negative for chills, fatigue, fever and unexpected weight change.  HENT:  Negative for congestion, ear pain, sinus pain and sore throat.   Respiratory:  Negative for cough and shortness of breath.   Cardiovascular:  Negative for chest pain and palpitations.  Gastrointestinal:  Negative for abdominal pain, blood in stool, constipation, diarrhea, nausea and vomiting.  Endocrine: Negative for polydipsia.  Genitourinary:  Negative for dysuria.  Musculoskeletal:  Negative for back pain.  Skin:  Negative for rash.  Neurological:  Negative for headaches.     Objective:  There were no vitals taken for this visit.     07/02/2022    1:27 PM  06/25/2022    2:18 PM 06/18/2022    2:47 PM  BP/Weight  Systolic BP 136 140 140  Diastolic BP 78 70 82  Wt. (Lbs) 390 399 398.2  BMI 55.96 kg/m2 57.25 kg/m2 57.14 kg/m2    Physical Exam  Diabetic Foot Exam - Simple   No data filed      Lab Results  Component Value Date   WBC 9.6 05/28/2022   HGB 11.8 (L) 05/28/2022   HCT 39.0 05/28/2022   PLT 367 05/28/2022   GLUCOSE 94 06/18/2022   CHOL 158 01/01/2022   TRIG 83 01/01/2022   HDL 37 (L) 01/01/2022   LDLCALC 105 (H) 01/01/2022   ALT 20 05/28/2022   AST 20 05/28/2022   NA 143 06/18/2022   K 4.6 06/18/2022   CL 101 06/18/2022   CREATININE 1.10 06/18/2022   BUN 21 06/18/2022   CO2 28 06/18/2022   HGBA1C 6.3 (H) 05/28/2022      Assessment & Plan:   Problem List Items Addressed This Visit       Cardiovascular and Mediastinum   Diastolic congestive heart failure (HCC) - Primary   Primary hypertension     Respiratory   Obstructive sleep apnea (adult) (pediatric)     Genitourinary   BPH (benign prostatic hyperplasia)     Other   Prediabetes  .  No orders of the defined types were placed in this encounter.   No orders of the defined types were placed in this encounter.    Follow-up: No follow-ups on file.  An After Visit Summary was printed and given to the patient.  Brent Bulla, MD [facility] [phone]

## 2022-07-09 ENCOUNTER — Encounter: Payer: Self-pay | Admitting: Legal Medicine

## 2022-07-09 ENCOUNTER — Ambulatory Visit: Payer: BLUE CROSS/BLUE SHIELD | Admitting: Legal Medicine

## 2022-07-09 VITALS — BP 122/80 | HR 87 | Temp 98.5°F | Resp 16 | Ht 70.0 in | Wt 396.0 lb

## 2022-07-09 DIAGNOSIS — I5032 Chronic diastolic (congestive) heart failure: Secondary | ICD-10-CM

## 2022-07-09 DIAGNOSIS — L97929 Non-pressure chronic ulcer of unspecified part of left lower leg with unspecified severity: Secondary | ICD-10-CM

## 2022-07-09 DIAGNOSIS — R7303 Prediabetes: Secondary | ICD-10-CM

## 2022-07-09 DIAGNOSIS — I10 Essential (primary) hypertension: Secondary | ICD-10-CM

## 2022-07-09 DIAGNOSIS — I83029 Varicose veins of left lower extremity with ulcer of unspecified site: Secondary | ICD-10-CM

## 2022-07-09 DIAGNOSIS — G4733 Obstructive sleep apnea (adult) (pediatric): Secondary | ICD-10-CM

## 2022-07-09 DIAGNOSIS — N400 Enlarged prostate without lower urinary tract symptoms: Secondary | ICD-10-CM

## 2022-07-18 ENCOUNTER — Ambulatory Visit (INDEPENDENT_AMBULATORY_CARE_PROVIDER_SITE_OTHER): Payer: BLUE CROSS/BLUE SHIELD | Admitting: Nurse Practitioner

## 2022-07-18 ENCOUNTER — Encounter: Payer: Self-pay | Admitting: Nurse Practitioner

## 2022-07-18 VITALS — BP 120/70 | HR 83 | Temp 97.3°F | Resp 18 | Ht 70.0 in | Wt >= 6400 oz

## 2022-07-18 DIAGNOSIS — I10 Essential (primary) hypertension: Secondary | ICD-10-CM | POA: Diagnosis not present

## 2022-07-18 DIAGNOSIS — R609 Edema, unspecified: Secondary | ICD-10-CM

## 2022-07-18 DIAGNOSIS — I83029 Varicose veins of left lower extremity with ulcer of unspecified site: Secondary | ICD-10-CM

## 2022-07-18 DIAGNOSIS — R600 Localized edema: Secondary | ICD-10-CM

## 2022-07-18 DIAGNOSIS — L97929 Non-pressure chronic ulcer of unspecified part of left lower leg with unspecified severity: Secondary | ICD-10-CM | POA: Diagnosis not present

## 2022-07-18 MED ORDER — BACITRACIN 500 UNIT/GM EX OINT: 1.0000 | TOPICAL_OINTMENT | Freq: Two times a day (BID) | CUTANEOUS | 0 refills | Status: AC

## 2022-07-18 NOTE — Assessment & Plan Note (Signed)
Suggested to use a pillow to lift the legs during night    

## 2022-07-18 NOTE — Assessment & Plan Note (Signed)
Wound looking better, smaller in size no sign of infection Suggested to put an bacitracin after cleaning and pat it dry and let it heal Suggested to wear compression shocks during days for peripheral edema Suggested to lift the legs during sleep

## 2022-07-18 NOTE — Progress Notes (Signed)
Subjective:  Patient ID: Victor Hamilton, male    DOB: 14-Jan-1961  Age: 61 y.o. MRN: [id number]  Chief Complaint  Patient presents with   leg wound    HPI Patient is here to follow up with his left leg wound which seems better after finishing the course of antibiotic. He does not complain any pain, drainage around the site and the wound size is getting smaller.   Current Outpatient Medications on File Prior to Visit  Medication Sig Dispense Refill   albuterol (VENTOLIN HFA) 108 (90 Base) MCG/ACT inhaler Inhale 2 puffs into the lungs every 6 (six) hours as needed for wheezing or shortness of breath. 8 g 6   AMERICAN GINSENG PO Take 500 mg by mouth daily.     aspirin EC 81 MG tablet Take 1 tablet (81 mg total) by mouth daily. Swallow whole. 90 tablet 3   atorvastatin (LIPITOR) 10 MG tablet Take 1 tablet (10 mg total) by mouth daily. 90 tablet 3   BREO ELLIPTA 100-25 MCG/ACT AEPB TAKE 1 INHALATION ONCE A DAY (Patient taking differently: Inhale 1 puff into the lungs daily.) 180 each 1   clindamycin (CLEOCIN) 150 MG capsule Take 1 capsule (150 mg total) by mouth 3 (three) times daily. 30 capsule 1   enalapril (VASOTEC) 10 MG tablet Take 1 tablet (10 mg total) by mouth daily. 90 tablet 2   furosemide (LASIX) 40 MG tablet Take 1.5 tablets (60 mg total) by mouth 2 (two) times daily. 90 tablet 11   lidocaine (LIDODERM) 5 % Place 1 patch onto the skin daily.     Potassium Gluconate 2.5 MEQ TABS Take 1 tablet by mouth daily.     No current facility-administered medications on file prior to visit.   Past Medical History:  Diagnosis Date   Morbid obesity due to excess calories (HCC)    Obstructive sleep apnea (adult) (pediatric)    No past surgical history on file.  Family History  Problem Relation Age of Onset   Transient ischemic attack Mother    Hypertension Father    Heart attack Father    Diabetes Father    Social History   Socioeconomic History   Marital status: Divorced    Spouse  name: Not on file   Number of children: 1   Years of education: Not on file   Highest education level: Not on file  Occupational History   Not on file  Tobacco Use   Smoking status: Never   Smokeless tobacco: Never  Vaping Use   Vaping Use: Never used  Substance and Sexual Activity   Alcohol use: Not Currently   Drug use: Not Currently   Sexual activity: Not on file  Other Topics Concern   Not on file  Social History Narrative   Not on file   Social Determinants of Health   Financial Resource Strain: Not on file  Food Insecurity: Not on file  Transportation Needs: Not on file  Physical Activity: Not on file  Stress: Not on file  Social Connections: Not on file    Review of Systems  Constitutional:  Negative for chills, fatigue and fever.  HENT:  Negative for congestion, ear pain and sore throat.   Respiratory:  Negative for cough and shortness of breath.   Cardiovascular:  Negative for chest pain and palpitations.  Gastrointestinal:  Negative for abdominal pain, constipation, diarrhea, nausea and vomiting.  Endocrine: Negative for polydipsia, polyphagia and polyuria.  Genitourinary:  Negative for dysuria and  frequency.  Musculoskeletal:  Negative for arthralgias and back pain.  Neurological:  Negative for dizziness and headaches.  Psychiatric/Behavioral:  Negative for dysphoric mood. The patient is not nervous/anxious.      Objective:  BP 120/70   Pulse 83   Temp (!) 97.3 F (36.3 C)   Resp 18   Ht 5\' 10"  (1.778 m)   Wt (!) 405 lb (183.7 kg)   SpO2 97%   BMI 58.11 kg/m      07/18/2022   11:00 AM 07/09/2022    7:59 AM 07/02/2022    1:27 PM  BP/Weight  Systolic BP 120 122 136  Diastolic BP 70 80 78  Wt. (Lbs) 405 396 390  BMI 58.11 kg/m2 56.82 kg/m2 55.96 kg/m2    Physical Exam Vitals reviewed.  Constitutional:      Appearance: Normal appearance.  Neck:     Vascular: No carotid bruit.  Cardiovascular:     Rate and Rhythm: Normal rate and  regular rhythm.     Pulses: Normal pulses.     Heart sounds: Normal heart sounds.  Pulmonary:     Breath sounds: Normal breath sounds.  Abdominal:     Tenderness: There is no abdominal tenderness.  Musculoskeletal:        General: Signs of injury (left lower leg ulcer) present.     Right lower leg: Edema present.     Left lower leg: Edema present.  Neurological:     Mental Status: He is alert and oriented to person, place, and time.  Psychiatric:        Mood and Affect: Mood normal.        Behavior: Behavior normal.    Lab Results  Component Value Date   WBC 9.6 05/28/2022   HGB 11.8 (L) 05/28/2022   HCT 39.0 05/28/2022   PLT 367 05/28/2022   GLUCOSE 94 06/18/2022   CHOL 158 01/01/2022   TRIG 83 01/01/2022   HDL 37 (L) 01/01/2022   LDLCALC 105 (H) 01/01/2022   ALT 20 05/28/2022   AST 20 05/28/2022   NA 143 06/18/2022   K 4.6 06/18/2022   CL 101 06/18/2022   CREATININE 1.10 06/18/2022   BUN 21 06/18/2022   CO2 28 06/18/2022   HGBA1C 6.3 (H) 05/28/2022      Assessment & Plan:   Problem List Items Addressed This Visit       Cardiovascular and Mediastinum   Ulcerated leg varices, left (HCC) - Primary    Wound looking better, smaller in size no sign of infection Suggested to put an bacitracin after cleaning and pat it dry and let it heal Suggested to wear compression shocks during days for peripheral edema Suggested to lift the legs during sleep      Relevant Medications   bacitracin 500 UNIT/GM ointment     Other   Peripheral edema    Suggested to use a pillow to lift the legs during night        Follow-up: in a week for chronic fasting  I, Vittorio Mohs have reviewed all documentation for this visit. The documentation on 07/18/22   for the exam, diagnosis, procedures, and orders are all accurate and complete.     An After Visit Summary was printed and given to the patient.  07/20/22, FNP [facility] [phone]

## 2022-07-19 ENCOUNTER — Telehealth: Payer: Self-pay

## 2022-07-19 LAB — BASIC METABOLIC PANEL
BUN/Creatinine Ratio: 20 (ref 10–24)
BUN: 19 mg/dL (ref 8–27)
CO2: 23 mmol/L (ref 20–29)
Calcium: 9.2 mg/dL (ref 8.6–10.2)
Chloride: 100 mmol/L (ref 96–106)
Creatinine, Ser: 0.97 mg/dL (ref 0.76–1.27)
Glucose: 115 mg/dL — ABNORMAL HIGH (ref 70–99)
Potassium: 4.6 mmol/L (ref 3.5–5.2)
Sodium: 142 mmol/L (ref 134–144)
eGFR: 89 mL/min/{1.73_m2} (ref >59)

## 2022-07-19 NOTE — Patient Outreach (Signed)
  Care Coordination   07/19/2022 Name: Victor Hamilton MRN: [id number] DOB: August 04, 1960   Care Coordination Outreach Attempts:  A third unsuccessful outreach was attempted today to offer the patient with information about available care coordination services as a benefit of their health plan.   Follow Up Plan:  No further outreach attempts will be made at this time. We have been unable to contact the patient to offer or enroll patient in care coordination services  Encounter Outcome:  No Answer   Care Coordination Interventions:  No, not indicated    Rowe Pavy, RN, BSN, CEN [facility] Coordinator [phone]

## 2022-07-20 ENCOUNTER — Other Ambulatory Visit: Payer: Self-pay | Admitting: Legal Medicine

## 2022-07-24 ENCOUNTER — Telehealth: Payer: Self-pay

## 2022-07-24 NOTE — Telephone Encounter (Signed)
LVM and My Chart Message per DPR- per Dr. Krasowski's note regarding normal lab results. Encouraged to call with any questions or concerns. Routed to PCP.  

## 2022-07-24 NOTE — Progress Notes (Signed)
Subjective:  Patient ID: Victor Hamilton, male    DOB: [birth year]  Age: 62 y.o. MRN: [id number]  Chief Complaints: recheck his foot  History of Present illness: Victor Hamilton is here to recheck his foot, he has a small wound in his left leg that is ongoing for a months and he was following up with Dr. Marina Goodell and he was treated with an oral antibiotics. He is applying bacitracin ointment as needed. He is wrapping in the day and leaving it open during the day.   For HYPERTENSION: BP at today's visit is 130/80.  Management includes enalapril 10 mg daily.Marland Kitchen  He reports excellent compliance with treatment. He is having not side effects. He is following a Regular, Low Sodium diet. He is not exercising much just some walking  He does not smoke. Use of agents associated with hypertension: none.  Outside blood pressures are ibuprofen , he is taking for back pain. Pertinent labs: Lab Results  Component Value Date   CHOL 158 01/01/2022   HDL 37 (L) 01/01/2022   LDLCALC 105 (H) 01/01/2022   TRIG 83 01/01/2022   CHOLHDL 4.3 01/01/2022   Lab Results  Component Value Date   NA 142 07/18/2022   K 4.6 07/18/2022   CREATININE 0.97 07/18/2022   EGFR 89 07/18/2022   GFRNONAA 90 05/25/2020   GLUCOSE 115 (H) 07/18/2022     The 10-year ASCVD risk score (Arnett DK, et al., 2019) is: 10.3%    Taking albuterol 2 puff as needed, for every week   Lipid/Cholesterol, Follow-up  Last lipid panel Other pertinent labs  Lab Results  Component Value Date   CHOL 158 01/01/2022   HDL 37 (L) 01/01/2022   LDLCALC 105 (H) 01/01/2022   TRIG 83 01/01/2022   CHOLHDL 4.3 01/01/2022   Lab Results  Component Value Date   ALT 20 05/28/2022   AST 20 05/28/2022   PLT 367 05/28/2022     He was last seen for this 3 months ago.  Management includes Lipitor 10 mg daily.Marland Kitchen  He reports good compliance with treatment. He is not having side effects.        Current diet: in general, a "healthy" diet   Current exercise:  no regular exercise except some walking  The 10-year ASCVD risk score (Arnett DK, et al., 2019) is: 10.3%    Current Outpatient Medications on File Prior to Visit  Medication Sig Dispense Refill   AMERICAN GINSENG PO Take 500 mg by mouth daily.     aspirin EC 81 MG tablet Take 1 tablet (81 mg total) by mouth daily. Swallow whole. 90 tablet 3   atorvastatin (LIPITOR) 10 MG tablet Take 1 tablet (10 mg total) by mouth daily. 90 tablet 3   bacitracin 500 UNIT/GM ointment Apply 1 Application topically 2 (two) times daily. 28 g 0   enalapril (VASOTEC) 10 MG tablet Take 1 tablet (10 mg total) by mouth daily. 90 tablet 2   furosemide (LASIX) 40 MG tablet Take 1.5 tablets (60 mg total) by mouth 2 (two) times daily. 90 tablet 11   lidocaine (LIDODERM) 5 % Place 1 patch onto the skin daily.     Potassium Gluconate 2.5 MEQ TABS Take 1 tablet by mouth daily.     No current facility-administered medications on file prior to visit.   Past Medical History:  Diagnosis Date   Morbid obesity due to excess calories (HCC)    Obstructive sleep apnea (adult) (pediatric)    History reviewed.  No pertinent surgical history.  Family History  Problem Relation Age of Onset   Transient ischemic attack Mother    Hypertension Father    Heart attack Father    Diabetes Father    Social History   Socioeconomic History   Marital status: Divorced    Spouse name: Not on file   Number of children: 1   Years of education: Not on file   Highest education level: Not on file  Occupational History   Not on file  Tobacco Use   Smoking status: Never   Smokeless tobacco: Never  Vaping Use   Vaping Use: Never used  Substance and Sexual Activity   Alcohol use: Not Currently   Drug use: Not Currently   Sexual activity: Not on file  Other Topics Concern   Not on file  Social History Narrative   Not on file   Social Determinants of Health   Financial Resource Strain: Not on file  Food Insecurity: Not on file   Transportation Needs: Not on file  Physical Activity: Not on file  Stress: Not on file  Social Connections: Not on file    Review of Systems  Constitutional:  Negative for chills, fatigue and fever.  HENT:  Negative for congestion, ear pain and sore throat.   Respiratory:  Negative for cough and shortness of breath.   Cardiovascular:  Negative for chest pain and palpitations.  Gastrointestinal:  Negative for abdominal pain, constipation, diarrhea, nausea and vomiting.  Endocrine: Negative for polydipsia, polyphagia and polyuria.  Genitourinary:  Negative for dysuria and frequency.  Musculoskeletal:  Negative for arthralgias and back pain.  Neurological:  Negative for dizziness and headaches.  Psychiatric/Behavioral:  Negative for dysphoric mood. The patient is not nervous/anxious.      Objective:  BP 130/80   Pulse 95   Temp (!) 97.2 F (36.2 C)   Resp 18   Ht 5\' 10"  (1.778 m)   Wt (!) 403 lb 9.6 oz (183.1 kg)   SpO2 96%   BMI 57.91 kg/m      07/25/2022    7:36 AM 07/18/2022   11:00 AM 07/09/2022    7:59 AM  BP/Weight  Systolic BP 130 120 122  Diastolic BP 80 70 80  Wt. (Lbs) 403.6 405 396  BMI 57.91 kg/m2 58.11 kg/m2 56.82 kg/m2    Physical Exam Vitals reviewed.  Constitutional:      Appearance: Normal appearance.  Neck:     Vascular: No carotid bruit.  Cardiovascular:     Rate and Rhythm: Normal rate and regular rhythm.     Pulses: Normal pulses.     Heart sounds: Normal heart sounds.  Pulmonary:     Breath sounds: Normal breath sounds.  Abdominal:     Tenderness: There is no abdominal tenderness.  Musculoskeletal:        General: Signs of injury (left leg ulcer) present.     Right lower leg: Edema present.     Left lower leg: Edema present.  Neurological:     Mental Status: He is alert and oriented to person, place, and time.  Psychiatric:        Mood and Affect: Mood normal.        Behavior: Behavior normal.    Lab Results  Component Value  Date   WBC 9.6 05/28/2022   HGB 11.8 (L) 05/28/2022   HCT 39.0 05/28/2022   PLT 367 05/28/2022   GLUCOSE 115 (H) 07/18/2022   CHOL 158 01/01/2022  TRIG 83 01/01/2022   HDL 37 (L) 01/01/2022   LDLCALC 105 (H) 01/01/2022   ALT 20 05/28/2022   AST 20 05/28/2022   NA 142 07/18/2022   K 4.6 07/18/2022   CL 100 07/18/2022   CREATININE 0.97 07/18/2022   BUN 19 07/18/2022   CO2 23 07/18/2022   HGBA1C 6.3 (H) 05/28/2022   Assessment & Plan:   Problem List Items Addressed This Visit       Cardiovascular and Mediastinum   Ulcerated leg varices, left (HCC) - Primary    Wound looking better, smaller in size Wound cleaned with Normal Saline and put triple antibiotic ointment in the clinic no sign of infection Suggested to put an bacitracin after cleaning and pat it dry and let it heal Suggested to wear compression shocks during days for peripheral edema Suggested to lift the legs during sleep      Diastolic congestive heart failure (HCC)    Undercontrolled Taking lasix 60 mg OD       Primary hypertension    Under controlled  Taking enalapril 10 mg OD  Nutrition: Stressed importance of moderation in sodium intake, saturated fat and cholesterol, caloric balance, sufficient intake of complex carbohydrates, fiber, calcium and iron.   Exercise: Stressed the importance of regular exercise.           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   Exercise: Stressed the importance of regular exercise.         Prediabetes    Patient's last A1C from 06/07/2022 was 6.3,  discussed about starting metformin 500 mg based on the future lab (next appointment feb/2024) Nutrition: Stressed importance of moderation in sodium intake, saturated fat and cholesterol, caloric balance, sufficient intake of complex carbohydrates, fiber, calcium and iron.   Exercise: Stressed the importance of regular exercise.          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 The documentation on 07/25/22   for the exam, diagnosis, procedures, and orders are all accurate and complete.     An After Visit Summary was printed and given to the patient.  Lurline Del, DNP, FNP [facility] [phone]

## 2022-07-25 ENCOUNTER — Ambulatory Visit: Payer: BLUE CROSS/BLUE SHIELD | Admitting: Nurse Practitioner

## 2022-07-25 ENCOUNTER — Encounter: Payer: Self-pay | Admitting: Nurse Practitioner

## 2022-07-25 VITALS — BP 130/80 | HR 95 | Temp 97.2°F | Resp 18 | Ht 70.0 in | Wt >= 6400 oz

## 2022-07-25 DIAGNOSIS — I83029 Varicose veins of left lower extremity with ulcer of unspecified site: Secondary | ICD-10-CM

## 2022-07-25 DIAGNOSIS — R7303 Prediabetes: Secondary | ICD-10-CM

## 2022-07-25 DIAGNOSIS — Z23 Encounter for immunization: Secondary | ICD-10-CM

## 2022-07-25 DIAGNOSIS — J4520 Mild intermittent asthma, uncomplicated: Secondary | ICD-10-CM | POA: Diagnosis not present

## 2022-07-25 DIAGNOSIS — I10 Essential (primary) hypertension: Secondary | ICD-10-CM

## 2022-07-25 DIAGNOSIS — I5032 Chronic diastolic (congestive) heart failure: Secondary | ICD-10-CM

## 2022-07-25 DIAGNOSIS — L97929 Non-pressure chronic ulcer of unspecified part of left lower leg with unspecified severity: Secondary | ICD-10-CM

## 2022-07-25 DIAGNOSIS — Z6841 Body Mass Index (BMI) 40.0 and over, adult: Secondary | ICD-10-CM

## 2022-07-25 DIAGNOSIS — R609 Edema, unspecified: Secondary | ICD-10-CM

## 2022-07-25 MED ORDER — FLUTICASONE FUROATE-VILANTEROL 100-25 MCG/ACT IN AEPB: INHALATION_SPRAY | RESPIRATORY_TRACT | 1 refills | Status: DC

## 2022-07-25 MED ORDER — ALBUTEROL SULFATE HFA 108 (90 BASE) MCG/ACT IN AERS: 2.0000 | INHALATION_SPRAY | Freq: Four times a day (QID) | RESPIRATORY_TRACT | 6 refills | Status: DC | PRN

## 2022-07-25 NOTE — Assessment & Plan Note (Signed)
Wound looking better, smaller in size Wound cleaned with Normal Saline and put triple antibiotic ointment in the clinic no sign of infection Suggested to put an bacitracin after cleaning and pat it dry and let it heal Suggested to wear compression shocks during days for peripheral edema Suggested to lift the legs during sleep

## 2022-07-25 NOTE — Assessment & Plan Note (Signed)
Suggested to use a pillow to lift the legs during night

## 2022-07-25 NOTE — Assessment & Plan Note (Signed)
Undercontrolled Taking lasix 60 mg OD

## 2022-07-25 NOTE — Assessment & Plan Note (Signed)
   Nutrition: Stressed importance of moderation in sodium intake, saturated fat and cholesterol, caloric balance, sufficient intake of complex carbohydrates, fiber, calcium and iron.   Exercise: Stressed the importance of regular exercise.    

## 2022-07-25 NOTE — Patient Instructions (Addendum)
Follow up in 2/15  DASH Eating Plan DASH stands for Dietary Approaches to Stop Hypertension. The DASH eating plan is a healthy eating plan that has been shown to: Reduce high blood pressure (hypertension). Reduce your risk for type 2 diabetes, heart disease, and stroke. Help with weight loss. What are tips for following this plan? Reading food labels Check food labels for the amount of salt (sodium) per serving. Choose foods with less than 5 percent of the Daily Value of sodium. Generally, foods with less than 300 milligrams (mg) of sodium per serving fit into this eating plan. To find whole grains, look for the word "whole" as the first word in the ingredient list. Shopping Buy products labeled as "low-sodium" or "no salt added." Buy fresh foods. Avoid canned foods and pre-made or frozen meals. Cooking Avoid adding salt when cooking. Use salt-free seasonings or herbs instead of table salt or sea salt. Check with your health care provider or pharmacist before using salt substitutes. Do not fry foods. Cook foods using healthy methods such as baking, boiling, grilling, roasting, and broiling instead. Cook with heart-healthy oils, such as olive, canola, avocado, soybean, or sunflower oil. Meal planning  Eat a balanced diet that includes: 4 or more servings of fruits and 4 or more servings of vegetables each day. Try to fill one-half of your plate with fruits and vegetables. 6-8 servings of whole grains each day. Less than 6 oz (170 g) of lean meat, poultry, or fish each day. A 3-oz (85-g) serving of meat is about the same size as a deck of cards. One egg equals 1 oz (28 g). 2-3 servings of low-fat dairy each day. One serving is 1 cup (237 mL). 1 serving of nuts, seeds, or beans 5 times each week. 2-3 servings of heart-healthy fats. Healthy fats called omega-3 fatty acids are found in foods such as walnuts, flaxseeds, fortified milks, and eggs. These fats are also found in cold-water fish,  such as sardines, salmon, and mackerel. Limit how much you eat of: Canned or prepackaged foods. Food that is high in trans fat, such as some fried foods. Food that is high in saturated fat, such as fatty meat. Desserts and other sweets, sugary drinks, and other foods with added sugar. Full-fat dairy products. Do not salt foods before eating. Do not eat more than 4 egg yolks a week. Try to eat at least 2 vegetarian meals a week. Eat more home-cooked food and less restaurant, buffet, and fast food. Lifestyle When eating at a restaurant, ask that your food be prepared with less salt or no salt, if possible. If you drink alcohol: Limit how much you use to: 0-1 drink a day for women who are not pregnant. 0-2 drinks a day for men. Be aware of how much alcohol is in your drink. In the [place] one drink equals one 12 oz bottle of beer (355 mL), one 5 oz glass of wine (148 mL), or one 1 oz glass of hard liquor (44 mL). General information Avoid eating more than 2,300 mg of salt a day. If you have hypertension, you may need to reduce your sodium intake to 1,500 mg a day. Work with your health care provider to maintain a healthy body weight or to lose weight. Ask what an ideal weight is for you. Get at least 30 minutes of exercise that causes your heart to beat faster (aerobic exercise) most days of the week. Activities may include walking, swimming, or biking. Work with your health  care provider or dietitian to adjust your eating plan to your individual calorie needs. What foods should I eat? Fruits All fresh, dried, or frozen fruit. Canned fruit in natural juice (without added sugar). Vegetables Fresh or frozen vegetables (raw, steamed, roasted, or grilled). Low-sodium or reduced-sodium tomato and vegetable juice. Low-sodium or reduced-sodium tomato sauce and tomato paste. Low-sodium or reduced-sodium canned vegetables. Grains Whole-grain or whole-wheat bread. Whole-grain or whole-wheat pasta.  Kinner rice. [place]. Bulgur. Whole-grain and low-sodium cereals. Pita bread. Low-fat, low-sodium crackers. Whole-wheat flour tortillas. Meats and other proteins Skinless chicken or [place]. Ground chicken or [place]. Pork with fat trimmed off. Fish and seafood. Egg whites. Dried beans, peas, or lentils. Unsalted nuts, nut butters, and seeds. Unsalted canned beans. Lean cuts of beef with fat trimmed off. Low-sodium, lean precooked or cured meat, such as sausages or meat loaves. Dairy Low-fat (1%) or fat-free (skim) milk. Reduced-fat, low-fat, or fat-free cheeses. Nonfat, low-sodium ricotta or cottage cheese. Low-fat or nonfat yogurt. Low-fat, low-sodium cheese. Fats and oils Soft margarine without trans fats. Vegetable oil. Reduced-fat, low-fat, or light mayonnaise and salad dressings (reduced-sodium). Canola, safflower, olive, avocado, soybean, and sunflower oils. Avocado. Seasonings and condiments Herbs. Spices. Seasoning mixes without salt. Other foods Unsalted popcorn and pretzels. Fat-free sweets. The items listed above may not be a complete list of foods and beverages you can eat. Contact a dietitian for more information. What foods should I avoid? Fruits Canned fruit in a light or heavy syrup. Fried fruit. Fruit in cream or butter sauce. Vegetables Creamed or fried vegetables. Vegetables in a cheese sauce. Regular canned vegetables (not low-sodium or reduced-sodium). Regular canned tomato sauce and paste (not low-sodium or reduced-sodium). Regular tomato and vegetable juice (not low-sodium or reduced-sodium). Angie Fava. Olives. Grains Baked goods made with fat, such as croissants, muffins, or some breads. Dry pasta or rice meal packs. Meats and other proteins Fatty cuts of meat. Ribs. Fried meat. Berniece Salines. Bologna, salami, and other precooked or cured meats, such as sausages or meat loaves. Fat from the back of a pig (fatback). Bratwurst. Salted nuts and seeds. Canned beans with added  salt. Canned or smoked fish. Whole eggs or egg yolks. Chicken or [place] with skin. Dairy Whole or 2% milk, cream, and half-and-half. Whole or full-fat cream cheese. Whole-fat or sweetened yogurt. Full-fat cheese. Nondairy creamers. Whipped toppings. Processed cheese and cheese spreads. Fats and oils Butter. Stick margarine. Lard. Shortening. Ghee. Bacon fat. Tropical oils, such as coconut, palm kernel, or palm oil. Seasonings and condiments Onion salt, garlic salt, seasoned salt, table salt, and sea salt. Worcestershire sauce. Tartar sauce. Barbecue sauce. Teriyaki sauce. Soy sauce, including reduced-sodium. Steak sauce. Canned and packaged gravies. Fish sauce. Oyster sauce. Cocktail sauce. Store-bought horseradish. Ketchup. Mustard. Meat flavorings and tenderizers. Bouillon cubes. Hot sauces. Pre-made or packaged marinades. Pre-made or packaged taco seasonings. Relishes. Regular salad dressings. Other foods Salted popcorn and pretzels. The items listed above may not be a complete list of foods and beverages you should avoid. Contact a dietitian for more information. Where to find more information National Heart, Lung, and Blood Institute: https://wilson-eaton.com/ American Heart Association: www.heart.org Academy of Nutrition and Dietetics: www.eatright.[place]: www.kidney.org Summary The DASH eating plan is a healthy eating plan that has been shown to reduce high blood pressure (hypertension). It may also reduce your risk for type 2 diabetes, heart disease, and stroke. When on the DASH eating plan, aim to eat more fresh fruits and vegetables, whole grains, lean proteins, low-fat dairy, and  heart-healthy fats. With the DASH eating plan, you should limit salt (sodium) intake to 2,300 mg a day. If you have hypertension, you may need to reduce your sodium intake to 1,500 mg a day. Work with your health care provider or dietitian to adjust your eating plan to your individual calorie  needs. This information is not intended to replace advice given to you by your health care provider. Make sure you discuss any questions you have with your health care provider. Document Revised: 06/11/2019 Document Reviewed: 06/11/2019 Elsevier Patient Education  2023 [employer].

## 2022-07-25 NOTE — Assessment & Plan Note (Signed)
Under controlled  Taking enalapril 10 mg OD  Nutrition: Stressed importance of moderation in sodium intake, saturated fat and cholesterol, caloric balance, sufficient intake of complex carbohydrates, fiber, calcium and iron.   Exercise: Stressed the importance of regular exercise.

## 2022-07-25 NOTE — Assessment & Plan Note (Signed)
Patient's last A1C from 06/07/2022 was 6.3,  discussed about starting metformin 500 mg based on the future lab (next appointment feb/2024) Nutrition: Stressed importance of moderation in sodium intake, saturated fat and cholesterol, caloric balance, sufficient intake of complex carbohydrates, fiber, calcium and iron.   Exercise: Stressed the importance of regular exercise.

## 2022-07-25 NOTE — Assessment & Plan Note (Signed)
Well controlled Not taking more than once a week Refill sent for both of this inhalers Suggested to eat healthy diet and a regular excercise

## 2022-08-19 DIAGNOSIS — M25512 Pain in left shoulder: Secondary | ICD-10-CM | POA: Diagnosis not present

## 2022-08-26 ENCOUNTER — Other Ambulatory Visit: Payer: Self-pay

## 2022-08-26 DIAGNOSIS — I10 Essential (primary) hypertension: Secondary | ICD-10-CM

## 2022-08-26 MED ORDER — FUROSEMIDE 40 MG PO TABS: 60.0000 mg | ORAL_TABLET | Freq: Two times a day (BID) | ORAL | 0 refills | Status: DC

## 2022-09-02 ENCOUNTER — Telehealth: Payer: Self-pay

## 2022-09-02 NOTE — Telephone Encounter (Signed)
I left a message on the number(s) listed in the patients chart requesting the patient to call back regarding the [place] appointment for 09/05/2022. The provider is out of the office that day. The appointment has been canceled. Waiting for the patient to return the call.

## 2022-09-05 ENCOUNTER — Ambulatory Visit: Payer: BLUE CROSS/BLUE SHIELD | Admitting: Nurse Practitioner

## 2022-09-24 ENCOUNTER — Encounter: Payer: Self-pay | Admitting: Cardiology

## 2022-09-24 ENCOUNTER — Ambulatory Visit: Payer: BLUE CROSS/BLUE SHIELD | Attending: Cardiology | Admitting: Cardiology

## 2022-09-24 VITALS — BP 134/74 | HR 78 | Ht 70.0 in | Wt 394.0 lb

## 2022-09-24 DIAGNOSIS — R7303 Prediabetes: Secondary | ICD-10-CM

## 2022-09-24 DIAGNOSIS — R609 Edema, unspecified: Secondary | ICD-10-CM

## 2022-09-24 DIAGNOSIS — Z6841 Body Mass Index (BMI) 40.0 and over, adult: Secondary | ICD-10-CM

## 2022-09-24 DIAGNOSIS — E785 Hyperlipidemia, unspecified: Secondary | ICD-10-CM

## 2022-09-24 DIAGNOSIS — R0609 Other forms of dyspnea: Secondary | ICD-10-CM

## 2022-09-24 DIAGNOSIS — I5032 Chronic diastolic (congestive) heart failure: Secondary | ICD-10-CM

## 2022-09-24 DIAGNOSIS — I10 Essential (primary) hypertension: Secondary | ICD-10-CM

## 2022-09-24 NOTE — Patient Instructions (Signed)
Medication Instructions:  Your physician recommends that you continue on your current medications as directed. Please refer to the Current Medication list given to you today.  *If you need a refill on your cardiac medications before your next appointment, please call your pharmacy*   Lab Work: Lipid panel, AST, ALT, BMP, ProBNP- today If you have labs (blood work) drawn today and your tests are completely normal, you will receive your results only by: [place] (if you have MyChart) OR A paper copy in the mail If you have any lab test that is abnormal or we need to change your treatment, we will call you to review the results.   Testing/Procedures: None Ordered   Follow-Up: At [facility], you and your health needs are our priority.  As part of our continuing mission to provide you with exceptional heart care, we have created designated Provider Care Teams.  These Care Teams include your primary Cardiologist (physician) and Advanced Practice Providers (APPs -  Physician Assistants and Nurse Practitioners) who all work together to provide you with the care you need, when you need it.  We recommend signing up for the patient portal called "MyChart".  Sign up information is provided on this After Visit Summary.  MyChart is used to connect with patients for Virtual Visits (Telemedicine).  Patients are able to view lab/test results, encounter notes, upcoming appointments, etc.  Non-urgent messages can be sent to your provider as well.   To learn more about what you can do with MyChart, go to [email or web address].    Your next appointment:   4 month(s)  The format for your next appointment:   In Person  Provider:   Jenne Campus, MD    Other Instructions NA

## 2022-09-24 NOTE — Progress Notes (Signed)
Cardiology Office Note:    Date:  09/24/2022   ID:  Victor Hamilton, DOB 01/03/1961, MRN [id number]  PCP:  Lillard Anes, MD (Inactive)  Cardiologist:  Jenne Campus, MD    Referring MD: Lillard Anes,*   Chief Complaint  Patient presents with   Follow-up  Doing well  History of Present Illness:    Victor Hamilton is a 62 y.o. male past medical history significant for morbid obesity, obstructive sleep apnea, swelling of lower extremities, diastolic dysfunction.  Originally referred to [place] because of swollen legs.  It is probably multifactorial.  He did have mild elevation of proBNP, he was put on diuretics with decent response.  He lost few pounds feeling better.  Denies have any chest pain tightness squeezing pressure mid chest no palpitation dizziness  Past Medical History:  Diagnosis Date   Morbid obesity due to excess calories (HCC)    Obstructive sleep apnea (adult) (pediatric)     History reviewed. No pertinent surgical history.  Current Medications: Current Meds  Medication Sig   albuterol (VENTOLIN HFA) 108 (90 Base) MCG/ACT inhaler Inhale 2 puffs into the lungs every 6 (six) hours as needed for wheezing or shortness of breath.   AMERICAN GINSENG PO Take 500 mg by mouth daily.   aspirin EC 81 MG tablet Take 1 tablet (81 mg total) by mouth daily. Swallow whole.   atorvastatin (LIPITOR) 10 MG tablet Take 1 tablet (10 mg total) by mouth daily.   bacitracin 500 UNIT/GM ointment Apply 1 Application topically 2 (two) times daily.   enalapril (VASOTEC) 10 MG tablet Take 1 tablet (10 mg total) by mouth daily.   fluticasone furoate-vilanterol (BREO ELLIPTA) 100-25 MCG/ACT AEPB TAKE 1 INHALATION BY MOUTH ONCE A DAY   furosemide (LASIX) 40 MG tablet Take 1.5 tablets (60 mg total) by mouth 2 (two) times daily.   lidocaine (LIDODERM) 5 % Place 1 patch onto the skin daily.   Potassium Gluconate 2.5 MEQ TABS Take 1 tablet by mouth daily.     Allergies:   Patient has  no known allergies.   Social History   Socioeconomic History   Marital status: Divorced    Spouse name: Not on file   Number of children: 1   Years of education: Not on file   Highest education level: Not on file  Occupational History   Not on file  Tobacco Use   Smoking status: Former    Types: Cigarettes   Smokeless tobacco: Never  Vaping Use   Vaping Use: Never used  Substance and Sexual Activity   Alcohol use: Not Currently   Drug use: Not Currently   Sexual activity: Not on file  Other Topics Concern   Not on file  Social History Narrative   Not on file   Social Determinants of Health   Financial Resource Strain: Not on file  Food Insecurity: Not on file  Transportation Needs: Not on file  Physical Activity: Not on file  Stress: Not on file  Social Connections: Not on file     Family History: The patient's family history includes Diabetes in his father; Heart attack in his father; Hypertension in his father; Transient ischemic attack in his mother. ROS:   Please see the history of present illness.    All 14 point review of systems negative except as described per history of present illness  EKGs/Labs/Other Studies Reviewed:      Recent Labs: 05/28/2022: ALT 20; Hemoglobin 11.8; NT-Pro BNP 922; Platelets  367 07/18/2022: BUN 19; Creatinine, Ser 0.97; Potassium 4.6; Sodium 142  Recent Lipid Panel    Component Value Date/Time   CHOL 158 01/01/2022 0920   TRIG 83 01/01/2022 0920   HDL 37 (L) 01/01/2022 0920   CHOLHDL 4.3 01/01/2022 0920   LDLCALC 105 (H) 01/01/2022 0920    Physical Exam:    VS:  BP 134/74 (BP Location: Right Arm, Patient Position: Sitting, Cuff Size: Large)   Pulse 78   Ht '5\' 10"'$  (1.778 m)   Wt (!) 394 lb (178.7 kg)   SpO2 91%   BMI 56.53 kg/m     Wt Readings from Last 3 Encounters:  09/24/22 (!) 394 lb (178.7 kg)  07/25/22 (!) 403 lb 9.6 oz (183.1 kg)  07/18/22 (!) 405 lb (183.7 kg)     GEN:  Well nourished, well developed  in no acute distress HEENT: Normal NECK: No JVD; No carotid bruits LYMPHATICS: No lymphadenopathy CARDIAC: RRR, no murmurs, no rubs, no gallops RESPIRATORY:  Clear to auscultation without rales, wheezing or rhonchi  ABDOMEN: Soft, non-tender, non-distended MUSCULOSKELETAL:  No edema; No deformity  SKIN: Warm and dry LOWER EXTREMITIES: Only minimal swelling NEUROLOGIC:  Alert and oriented x 3 PSYCHIATRIC:  Normal affect   ASSESSMENT:    1. Dyslipidemia   2. Primary hypertension   3. Dyspnea on exertion   4. Chronic diastolic congestive heart failure ([place])   5. BMI 50.0-59.9, adult ([place])   6. Prediabetes   7. Peripheral edema    PLAN:    In order of problems listed above:  Diastolic congestive heart failure.  Will check his proBNP today.  He is on diuretic I will check Chem-7 to see if there is any room for potentially increasing dose of diuretics.  Clinically he is improving however it is a slow improvement Swelling of lower extremities I think is multifactorial diastolic congestive heart failure probably some role here, obstructive sleep apnea as well as morbid obesity.  He is working hard on modification of those factors. Essential hypertension blood pressure well-controlled continue present management. Prediabetes that being followed by antimedicine team I do have data from November of last year which show me hemoglobin A1c 6.3.  Will continue present management.   Medication Adjustments/Labs and Tests Ordered: Current medicines are reviewed at length with the patient today.  Concerns regarding medicines are outlined above.  Orders Placed This Encounter  Procedures   ALT   AST   Lipid panel   Basic metabolic panel   Pro b natriuretic peptide (BNP)   Medication changes: No orders of the defined types were placed in this encounter.   Signed, Park Liter, MD, [facility] 09/24/2022 8:32 AM    [place]

## 2022-09-25 LAB — BASIC METABOLIC PANEL
BUN/Creatinine Ratio: 19 (ref 10–24)
BUN: 19 mg/dL (ref 8–27)
CO2: 27 mmol/L (ref 20–29)
Calcium: 9.4 mg/dL (ref 8.6–10.2)
Chloride: 99 mmol/L (ref 96–106)
Creatinine, Ser: 1.02 mg/dL (ref 0.76–1.27)
Glucose: 117 mg/dL — ABNORMAL HIGH (ref 70–99)
Potassium: 4.8 mmol/L (ref 3.5–5.2)
Sodium: 139 mmol/L (ref 134–144)
eGFR: 84 mL/min/{1.73_m2} (ref >59)

## 2022-09-25 LAB — PRO B NATRIURETIC PEPTIDE: NT-Pro BNP: 91 pg/mL (ref 0–210)

## 2022-09-25 LAB — LIPID PANEL
Chol/HDL Ratio: 3.3 ratio (ref 0.0–5.0)
Cholesterol, Total: 138 mg/dL (ref 100–199)
HDL: 42 mg/dL (ref >39)
LDL Chol Calc (NIH): 76 mg/dL (ref 0–99)
Triglycerides: 107 mg/dL (ref 0–149)
VLDL Cholesterol Cal: 20 mg/dL (ref 5–40)

## 2022-09-25 LAB — ALT: ALT: 15 IU/L (ref 0–44)

## 2022-09-25 LAB — AST: AST: 17 IU/L (ref 0–40)

## 2022-09-26 ENCOUNTER — Telehealth: Payer: Self-pay

## 2022-09-26 NOTE — Telephone Encounter (Signed)
Left message on My Chart with normal results per Dr. Wendy Poet note. Routed to PCP.

## 2023-02-24 ENCOUNTER — Other Ambulatory Visit: Payer: Self-pay

## 2023-02-24 MED ORDER — ENALAPRIL MALEATE 10 MG PO TABS: 10.0000 mg | ORAL_TABLET | Freq: Every day | ORAL | 0 refills | Status: DC

## 2023-03-08 DIAGNOSIS — R0602 Shortness of breath: Secondary | ICD-10-CM | POA: Diagnosis not present

## 2023-03-11 DIAGNOSIS — R0600 Dyspnea, unspecified: Secondary | ICD-10-CM | POA: Diagnosis not present

## 2023-03-11 DIAGNOSIS — R0602 Shortness of breath: Secondary | ICD-10-CM | POA: Diagnosis not present

## 2023-03-11 DIAGNOSIS — R0981 Nasal congestion: Secondary | ICD-10-CM | POA: Diagnosis not present

## 2023-03-11 DIAGNOSIS — R059 Cough, unspecified: Secondary | ICD-10-CM | POA: Diagnosis not present

## 2023-03-11 DIAGNOSIS — R062 Wheezing: Secondary | ICD-10-CM | POA: Diagnosis not present

## 2023-03-11 DIAGNOSIS — U071 COVID-19: Secondary | ICD-10-CM | POA: Diagnosis not present

## 2023-04-23 DIAGNOSIS — M79672 Pain in left foot: Secondary | ICD-10-CM | POA: Diagnosis not present

## 2023-05-08 ENCOUNTER — Other Ambulatory Visit: Payer: Self-pay | Admitting: Family Medicine

## 2023-05-13 ENCOUNTER — Ambulatory Visit: Payer: BLUE CROSS/BLUE SHIELD | Attending: Cardiology | Admitting: Cardiology

## 2023-05-13 ENCOUNTER — Encounter: Payer: Self-pay | Admitting: Cardiology

## 2023-05-13 VITALS — BP 138/62 | HR 81 | Ht 70.0 in | Wt 387.2 lb

## 2023-05-13 DIAGNOSIS — I5032 Chronic diastolic (congestive) heart failure: Secondary | ICD-10-CM

## 2023-05-13 DIAGNOSIS — Z6841 Body Mass Index (BMI) 40.0 and over, adult: Secondary | ICD-10-CM | POA: Diagnosis not present

## 2023-05-13 DIAGNOSIS — G4733 Obstructive sleep apnea (adult) (pediatric): Secondary | ICD-10-CM | POA: Diagnosis not present

## 2023-05-13 DIAGNOSIS — I10 Essential (primary) hypertension: Secondary | ICD-10-CM | POA: Diagnosis not present

## 2023-05-13 NOTE — Progress Notes (Unsigned)
Cardiology Office Note:    Date:  05/13/2023   ID:  Victor Hamilton, DOB [birth year], MRN [id number]  PCP:  Abigail Miyamoto, MD (Inactive)  Cardiologist:  Gypsy Balsam, MD    Referring MD: No ref. provider found   Chief Complaint  Patient presents with   Follow-up    History of Present Illness:    Victor Hamilton is a 62 y.o. male past medical history significant for morbid obesity, obstructive sleep apnea, swelling of lower extremities, diastolic dysfunction.  Comes today to months for follow-up.  Overall doing fine gradual losing weight not at speed that he would like to but still at least he is making progress.  No chest pain no tightness no squeezing no pressure no burning in the chest  Past Medical History:  Diagnosis Date   Morbid obesity due to excess calories (HCC)    Obstructive sleep apnea (adult) (pediatric)     History reviewed. No pertinent surgical history.  Current Medications: Current Meds  Medication Sig   albuterol (VENTOLIN HFA) 108 (90 Base) MCG/ACT inhaler Inhale 2 puffs into the lungs every 6 (six) hours as needed for wheezing or shortness of breath.   AMERICAN GINSENG PO Take 500 mg by mouth daily.   aspirin EC 81 MG tablet Take 1 tablet (81 mg total) by mouth daily. Swallow whole.   atorvastatin (LIPITOR) 10 MG tablet Take 1 tablet (10 mg total) by mouth daily.   bacitracin 500 UNIT/GM ointment Apply 1 Application topically 2 (two) times daily.   enalapril (VASOTEC) 10 MG tablet Take 1 tablet (10 mg total) by mouth daily.   fluticasone furoate-vilanterol (BREO ELLIPTA) 100-25 MCG/ACT AEPB TAKE 1 INHALATION BY MOUTH ONCE A DAY (Patient taking differently: Inhale 1 puff into the lungs daily. TAKE 1 INHALATION BY MOUTH ONCE A DAY)   furosemide (LASIX) 40 MG tablet Take 1.5 tablets (60 mg total) by mouth 2 (two) times daily.   lidocaine (LIDODERM) 5 % Place 1 patch onto the skin daily.   meloxicam (MOBIC) 15 MG tablet Take 15 mg by mouth daily as  needed for pain.   Potassium Gluconate 2.5 MEQ TABS Take 1 tablet by mouth daily.     Allergies:   Patient has no known allergies.   Social History   Socioeconomic History   Marital status: Divorced    Spouse name: Not on file   Number of children: 1   Years of education: Not on file   Highest education level: Not on file  Occupational History   Not on file  Tobacco Use   Smoking status: Former    Types: Cigarettes   Smokeless tobacco: Never  Vaping Use   Vaping status: Never Used  Substance and Sexual Activity   Alcohol use: Not Currently   Drug use: Not Currently   Sexual activity: Not on file  Other Topics Concern   Not on file  Social History Narrative   Not on file   Social Determinants of Health   Financial Resource Strain: Not on file  Food Insecurity: Not on file  Transportation Needs: Not on file  Physical Activity: Not on file  Stress: Not on file  Social Connections: Not on file     Family History: The patient's family history includes Diabetes in his father; Heart attack in his father; Hypertension in his father; Transient ischemic attack in his mother. ROS:   Please see the history of present illness.    All 14 point review of systems  negative except as described per history of present illness  EKGs/Labs/Other Studies Reviewed:    EKG Interpretation Date/Time:  Tuesday May 13 2023 15:14:22 EDT Ventricular Rate:  81 PR Interval:  182 QRS Duration:  144 QT Interval:  390 QTC Calculation: 453 R Axis:   -74  Text Interpretation: Normal sinus rhythm Right bundle branch block Left anterior fascicular block Bifascicular block Abnormal ECG No previous ECGs available Confirmed by Gypsy Balsam [phone]) on 05/13/2023 3:15:34 PM    Recent Labs: 05/28/2022: Hemoglobin 11.8; Platelets 367 09/24/2022: ALT 15; BUN 19; Creatinine, Ser 1.02; NT-Pro BNP 91; Potassium 4.8; Sodium 139  Recent Lipid Panel    Component Value Date/Time   CHOL 138  09/24/2022 0831   TRIG 107 09/24/2022 0831   HDL 42 09/24/2022 0831   CHOLHDL 3.3 09/24/2022 0831   LDLCALC 76 09/24/2022 0831    Physical Exam:    VS:  BP 138/62 (BP Location: Left Arm, Patient Position: Sitting)   Pulse 81   Ht 5\' 10"  (1.778 m)   Wt (!) 387 lb 3.2 oz (175.6 kg)   SpO2 94%   BMI 55.56 kg/m     Wt Readings from Last 3 Encounters:  05/13/23 (!) 387 lb 3.2 oz (175.6 kg)  09/24/22 (!) 394 lb (178.7 kg)  07/25/22 (!) 403 lb 9.6 oz (183.1 kg)     GEN:  Well nourished, well developed in no acute distress HEENT: Normal NECK: No JVD; No carotid bruits LYMPHATICS: No lymphadenopathy CARDIAC: RRR, no murmurs, no rubs, no gallops RESPIRATORY:  Clear to auscultation without rales, wheezing or rhonchi  ABDOMEN: Soft, non-tender, non-distended MUSCULOSKELETAL:  No edema; No deformity  SKIN: Warm and dry LOWER EXTREMITIES: no swelling NEUROLOGIC:  Alert and oriented x 3 PSYCHIATRIC:  Normal affect   ASSESSMENT:    1. Primary hypertension   2. Chronic diastolic congestive heart failure (HCC)   3. Obstructive sleep apnea (adult) (pediatric)   4. BMI 50.0-59.9, adult (HCC)    PLAN:    In order of problems listed above:  Essential hypertension blood pressure well-controlled continue present management. Chronic diastolic congestive heart failure will check his Chem-7.  Gradually losing weight. Not much swelling. Obstructive sleep apnea using CPAP mask. Obesity not at   Medication Adjustments/Labs and Tests Ordered: Current medicines are reviewed at length with the patient today.  Concerns regarding medicines are outlined above.  Orders Placed This Encounter  Procedures   EKG 12-Lead   Medication changes: No orders of the defined types were placed in this encounter.   Signed, Georgeanna Lea, MD, [facility] 05/13/2023 3:25 PM    [facility]

## 2023-05-13 NOTE — Patient Instructions (Signed)
Medication Instructions:  Your physician recommends that you continue on your current medications as directed. Please refer to the Current Medication list given to you today.  *If you need a refill on your cardiac medications before your next appointment, please call your pharmacy*   Lab Work: BMP- today If you have labs (blood work) drawn today and your tests are completely normal, you will receive your results only by: [place] (if you have MyChart) OR A paper copy in the mail If you have any lab test that is abnormal or we need to change your treatment, we will call you to review the results.   Testing/Procedures: None Ordered   Follow-Up: At [facility], you and your health needs are our priority.  As part of our continuing mission to provide you with exceptional heart care, we have created designated Provider Care Teams.  These Care Teams include your primary Cardiologist (physician) and Advanced Practice Providers (APPs -  Physician Assistants and Nurse Practitioners) who all work together to provide you with the care you need, when you need it.  We recommend signing up for the patient portal called "MyChart".  Sign up information is provided on this After Visit Summary.  MyChart is used to connect with patients for Virtual Visits (Telemedicine).  Patients are able to view lab/test results, encounter notes, upcoming appointments, etc.  Non-urgent messages can be sent to your provider as well.   To learn more about what you can do with MyChart, go to [email or web address].    Your next appointment:   6 month(s)  The format for your next appointment:   In Person  Provider:   Jenne Campus, MD    Other Instructions NA

## 2023-05-14 LAB — BASIC METABOLIC PANEL
BUN/Creatinine Ratio: 16 (ref 10–24)
BUN: 14 mg/dL (ref 8–27)
CO2: 25 mmol/L (ref 20–29)
Calcium: 9.4 mg/dL (ref 8.6–10.2)
Chloride: 99 mmol/L (ref 96–106)
Creatinine, Ser: 0.9 mg/dL (ref 0.76–1.27)
Glucose: 106 mg/dL — ABNORMAL HIGH (ref 70–99)
Potassium: 4.7 mmol/L (ref 3.5–5.2)
Sodium: 141 mmol/L (ref 134–144)
eGFR: 97 mL/min/{1.73_m2} (ref >59)

## 2023-05-20 ENCOUNTER — Telehealth: Payer: Self-pay

## 2023-05-20 NOTE — Telephone Encounter (Signed)
-----   Message from Gypsy Balsam sent at 05/16/2023  1:41 PM EDT ----- Chem-7 looks good, continue present management

## 2023-05-20 NOTE — Telephone Encounter (Signed)
LM to return my call. 

## 2023-05-21 ENCOUNTER — Telehealth: Payer: Self-pay

## 2023-05-21 NOTE — Telephone Encounter (Signed)
Left message on My Chart with normal Lab results per Dr. Vanetta Shawl note. Routed to PCP.

## 2023-05-27 ENCOUNTER — Telehealth: Payer: Self-pay

## 2023-05-27 NOTE — Telephone Encounter (Signed)
Pt viewed results on My Chart per Dr. Krasowski's note. Routed to PCP.  

## 2023-06-12 ENCOUNTER — Other Ambulatory Visit: Payer: Self-pay | Admitting: Family Medicine

## 2023-07-02 ENCOUNTER — Ambulatory Visit (HOSPITAL_BASED_OUTPATIENT_CLINIC_OR_DEPARTMENT_OTHER)
Admission: RE | Admit: 2023-07-02 | Discharge: 2023-07-02 | Disposition: A | Discharge status: Other Acute Inpatient Hospital | Payer: BLUE CROSS/BLUE SHIELD | Source: Ambulatory Visit | Attending: Internal Medicine | Admitting: Internal Medicine

## 2023-07-02 ENCOUNTER — Encounter (HOSPITAL_BASED_OUTPATIENT_CLINIC_OR_DEPARTMENT_OTHER): Payer: Self-pay

## 2023-07-02 ENCOUNTER — Ambulatory Visit: Payer: Self-pay | Admitting: Legal Medicine

## 2023-07-02 VITALS — BP 149/82 | HR 95 | Temp 97.8°F | Resp 22 | Wt 380.0 lb

## 2023-07-02 DIAGNOSIS — R0603 Acute respiratory distress: Secondary | ICD-10-CM

## 2023-07-02 DIAGNOSIS — I4510 Unspecified right bundle-branch block: Secondary | ICD-10-CM | POA: Diagnosis not present

## 2023-07-02 DIAGNOSIS — I444 Left anterior fascicular block: Secondary | ICD-10-CM | POA: Diagnosis not present

## 2023-07-02 DIAGNOSIS — R0902 Hypoxemia: Secondary | ICD-10-CM | POA: Diagnosis not present

## 2023-07-02 DIAGNOSIS — I509 Heart failure, unspecified: Secondary | ICD-10-CM | POA: Diagnosis not present

## 2023-07-02 DIAGNOSIS — J4550 Severe persistent asthma, uncomplicated: Secondary | ICD-10-CM | POA: Diagnosis not present

## 2023-07-02 DIAGNOSIS — R0602 Shortness of breath: Secondary | ICD-10-CM | POA: Diagnosis not present

## 2023-07-02 DIAGNOSIS — R9431 Abnormal electrocardiogram [ECG] [EKG]: Secondary | ICD-10-CM | POA: Diagnosis not present

## 2023-07-02 DIAGNOSIS — J4520 Mild intermittent asthma, uncomplicated: Secondary | ICD-10-CM

## 2023-07-02 DIAGNOSIS — J45901 Unspecified asthma with (acute) exacerbation: Secondary | ICD-10-CM | POA: Diagnosis not present

## 2023-07-02 DIAGNOSIS — Z87891 Personal history of nicotine dependence: Secondary | ICD-10-CM | POA: Diagnosis not present

## 2023-07-02 DIAGNOSIS — Z1152 Encounter for screening for COVID-19: Secondary | ICD-10-CM | POA: Diagnosis not present

## 2023-07-02 MED ORDER — PREDNISONE 20 MG PO TABS
40.0000 mg | ORAL_TABLET | Freq: Every day | ORAL | 0 refills | Status: DC
Start: 2023-07-02 — End: 2023-07-02

## 2023-07-02 MED ORDER — ALBUTEROL SULFATE HFA 108 (90 BASE) MCG/ACT IN AERS: 2.0000 | INHALATION_SPRAY | Freq: Four times a day (QID) | RESPIRATORY_TRACT | 1 refills | Status: DC | PRN

## 2023-07-02 MED ORDER — ALBUTEROL SULFATE (2.5 MG/3ML) 0.083% IN NEBU
2.5000 mg | INHALATION_SOLUTION | Freq: Once | RESPIRATORY_TRACT | Status: AC
  Administered 2023-07-02: 2.5 mg via RESPIRATORY_TRACT

## 2023-07-02 MED ORDER — METHYLPREDNISOLONE ACETATE 40 MG/ML IJ SUSP
40.0000 mg | Freq: Once | INTRAMUSCULAR | Status: AC
  Administered 2023-07-02: 40 mg via INTRAMUSCULAR

## 2023-07-02 MED ORDER — IPRATROPIUM-ALBUTEROL 0.5-2.5 (3) MG/3ML IN SOLN
3.0000 mL | Freq: Once | RESPIRATORY_TRACT | Status: AC
  Administered 2023-07-02: 3 mL via RESPIRATORY_TRACT

## 2023-07-02 NOTE — ED Triage Notes (Signed)
Patient arrives severely short of breath. States has been out of inhaler x 1 month. Shortness of breath worsening over last week. Patient needing to stop several times during conversation to catch breath. Audible wheezing noted. Skin p/w/d. Resp rapid with use of accessory muscles. Provider at bedside immediately.

## 2023-07-02 NOTE — Telephone Encounter (Signed)
Chief Complaint: Shortness of breath Symptoms: short of breath with exertion Frequency: intermittent x 1 week Pertinent Negatives: Patient denies chest pain, dizziness, cough, runny nose, fever Disposition: [] ED /[x] Urgent Care (no appt availability in office) / [] Appointment(In office/virtual)/ []  [facility]/ [] Home Care/ [] Refused Recommended Disposition /[] [facility]/ []  Follow-up with PCP Additional Notes: Pt called in stating he is out of his inhalers for approx one month and has had increased SOB with exertion at work x 1 week. Pt denies symptoms such as chest pain, dizziness, cough, runny nose, or fever. Pt states he feels this is due to the weather changes. Per protocol, pt to be seen within 4 hours. Next available appt within practice is 12/12 @1040 . Pt agreeable to be seen at [facility] in [place], appt made for today at 1430. Pt educated on care advice, agreeable to plan.  Pt is also requesting annual appt with provider within practice, states his previous provider is no longer there. Alerting clinic staff for review.   Copied from CRM [id number]. Topic: Clinical - Red Word Triage >> Jul 02, 2023 12:55 PM Danika B wrote: Red Word that prompted transfer to Nurse Triage: Severe shortness of breath, feels like doesn't have enough air to keep moving, also needs fluticasone furoate-vilanterol (BREO ELLIPTA) refill Reason for Disposition  [1] MILD difficulty breathing (e.g., minimal/no SOB at rest, SOB with walking, pulse <100) AND [2] NEW-onset or WORSE than normal  Answer Assessment - Initial Assessment Questions 1. RESPIRATORY STATUS: "Describe your breathing?" (e.g., wheezing, shortness of breath, unable to speak, severe coughing)      Shortness of breath, ran out of medications approx 1 month ago  2. ONSET: "When did this breathing problem begin?"      Approx 1 week 3. PATTERN "Does the difficult breathing come and go, or has it been constant since it started?"       Intermittent, worse with movement 4. SEVERITY: "How bad is your breathing?" (e.g., mild, moderate, severe)    - MILD: No SOB at rest, mild SOB with walking, speaks normally in sentences, can lie down, no retractions, pulse < 100.    - MODERATE: SOB at rest, SOB with minimal exertion and prefers to sit, cannot lie down flat, speaks in phrases, mild retractions, audible wheezing, pulse 100-120.    - SEVERE: Very SOB at rest, speaks in single words, struggling to breathe, sitting hunched forward, retractions, pulse > 120      Moderate 5. RECURRENT SYMPTOM: "Have you had difficulty breathing before?" If Yes, ask: "When was the last time?" and "What happened that time?"      Yes, hx of asthma as child. SOB but usually controlled with inhaler. Symptoms have been worse since having covid a while back. 6. CARDIAC HISTORY: "Do you have any history of heart disease?" (e.g., heart attack, angina, bypass surgery, angioplasty)      Denies 7. LUNG HISTORY: "Do you have any history of lung disease?"  (e.g., pulmonary embolus, asthma, emphysema)     Asthma as a child, denies others 8. CAUSE: "What do you think is causing the breathing problem?"      Out of medication and weather 9. OTHER SYMPTOMS: "Do you have any other symptoms? (e.g., dizziness, runny nose, cough, chest pain, fever)     Denies 10. O2 SATURATION MONITOR:  "Do you use an oxygen saturation monitor (pulse oximeter) at home?" If Yes, ask: "What is your reading (oxygen level) today?" "What is your usual oxygen saturation  reading?" (e.g., 95%)       Does not have pulse ox.  Protocols used: Breathing Difficulty-A-AH

## 2023-07-02 NOTE — ED Notes (Signed)
Patient is being discharged from the Urgent Care and sent to the Emergency Department via EMS . Per Eula Fried., NP, patient is in need of higher level of care due to respiratory distress. Patient is aware and verbalizes understanding of plan of care.  Vitals:   07/02/23 1436 07/02/23 1455  BP: 127/79 (!) 149/82  Pulse: 90 95  Resp: 20 (!) 22  Temp:    SpO2: 90% 90%

## 2023-07-02 NOTE — ED Provider Notes (Signed)
[facility]    CSN: [id number] Arrival date & time: 07/02/23  1401      History   Chief Complaint Chief Complaint  Patient presents with   Shortness of Breath    HPI Victor Hamilton is a 62 y.o. male.   Victor Hamilton is a 62 y.o. male presenting for chief complaint of Shortness of Breath that started approximately 1 week ago. Reports associated dry cough and bilateral chest tightness associated with shortness of breath for the last 1 week. History of asthma and CHF. Symptoms have been gradually worsening over the last few days and have not responded well to albuterol inhaler at home. He has been out of his Brio-Ellipta inhaler for the last 1 month. Denies recent fevers, chills, changes in weight, leg swelling, orthopnea, sore throat, dizziness, palpitations, headaches, nausea, vomiting, and rash. No recent sick contacts with similar symptoms. Cigarette smoker for 20 years, quit 20 years ago, no other current tobacco use.Taking OTC medicines without relief.    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 No pertinent surgical history.     Home Medications    Prior to Admission medications   Medication Sig Start Date End Date Taking? Authorizing Provider  AMERICAN GINSENG PO Take 500 mg by mouth daily.    [provider]  aspirin EC 81  MG tablet Take 1 tablet (81 mg total) by mouth daily. Swallow whole. 06/18/22   Georgeanna Lea, MD  atorvastatin (LIPITOR) 10 MG tablet Take 1 tablet (10 mg total) by mouth daily. 06/18/22   Georgeanna Lea, MD  bacitracin 500 UNIT/GM ointment Apply 1 Application topically 2 (two) times daily. 07/18/22   Lurline Del, FNP  enalapril (VASOTEC) 10 MG tablet Take 1 tablet (10 mg total) by mouth daily. 05/08/23 06/07/23  Cox, Fritzi Mandes, MD  fluticasone furoate-vilanterol (BREO ELLIPTA) 100-25 MCG/ACT AEPB TAKE 1 INHALATION BY MOUTH ONCE A DAY Patient taking differently: Inhale 1 puff into the lungs daily. TAKE 1 INHALATION BY MOUTH ONCE A DAY 07/25/22   Lurline Del, FNP  furosemide (LASIX) 40 MG tablet Take 1.5 tablets (60 mg total) by mouth 2 (two) times daily. 08/26/22   Cox, Fritzi Mandes, MD  lidocaine (LIDODERM) 5 % Place 1 patch onto the skin daily. 05/09/22   [provider]  meloxicam (MOBIC) 15 MG tablet Take 15 mg by mouth daily as needed for pain.    [provider]  Potassium Gluconate 2.5 MEQ TABS Take 1 tablet by mouth daily.    [provider]    Family History Family History  Problem Relation Age of Onset   Transient ischemic attack Mother    Hypertension Father    Heart attack Father    Diabetes Father     Social History Social History   Tobacco Use   Smoking status: Former    Types:  Cigarettes   Smokeless tobacco: Never  Vaping Use   Vaping status: Never Used  Substance Use Topics   Alcohol use: Not Currently   Drug use: Not Currently     Allergies   Patient has no known allergies.   Review of Systems Review of Systems  Respiratory:  Positive for shortness of breath.   Per HPI   Physical Exam Triage Vital Signs ED Triage Vitals  Encounter Vitals Group     BP 07/02/23 1412 (!) 170/81     Systolic BP Percentile --      Diastolic BP Percentile --      Pulse Rate 07/02/23 1412 92     Resp 07/02/23 1412 (!) 24     Temp 07/02/23  1412 97.8 F (36.6 C)     Temp Source 07/02/23 1412 Oral     SpO2 07/02/23 1412 92 %     Weight 07/02/23 1413 (!) 380 lb (172.4 kg)     Height --      Head Circumference --      Peak Flow --      Pain Score 07/02/23 1413 0     Pain Loc --      Pain Education --      Exclude from Growth Chart --    No data found.  Updated Vital Signs BP 127/79 (BP Location: Right Arm)   Pulse 90   Temp 97.8 F (36.6 C) (Oral)   Resp 20   Wt (!) 380 lb (172.4 kg)   SpO2 90%   BMI 54.52 kg/m   Visual Acuity Right Eye Distance:   Left Eye Distance:   Bilateral Distance:    Right Eye Near:   Left Eye Near:    Bilateral Near:     Physical Exam Vitals and nursing note reviewed.  Constitutional:      Appearance: He is not ill-appearing or toxic-appearing.  HENT:     Head: Normocephalic and atraumatic.     Right Ear: Hearing and external ear normal.     Left Ear: Hearing and external ear normal.     Nose: Nose normal.     Mouth/Throat:     Lips: Pink.  Eyes:     General: Lids are normal. Vision grossly intact. Gaze aligned appropriately.     Extraocular Movements: Extraocular movements intact.     Conjunctiva/sclera: Conjunctivae normal.  Cardiovascular:     Rate and Rhythm: Normal rate and regular rhythm.     Heart sounds: Normal heart sounds, S1 normal and S2 normal.  Pulmonary:     Effort: Pulmonary effort is normal. Tachypnea present. No respiratory distress.     Breath sounds: Normal air entry. Decreased breath sounds and wheezing present.     Comments: Decreased breath sounds and expiratory wheezing heard throughout bilaterally. Moderate difficulty speaking in full sentences secondary to shortness of breath.  Musculoskeletal:     Cervical back: Neck supple.     Right lower leg: No edema.     Left lower leg: No edema.  Lymphadenopathy:     Cervical: No cervical adenopathy.  Skin:    General: Skin is warm and dry.     Capillary Refill: Capillary refill takes less than 2  seconds.     Findings: No rash.  Neurological:     General: No focal deficit present.     Mental Status: He is alert and oriented to person, place, and time. Mental status is at baseline.     Cranial  Nerves: No dysarthria or facial asymmetry.  Psychiatric:        Mood and Affect: Mood normal.        Speech: Speech normal.        Behavior: Behavior normal.        Thought Content: Thought content normal.        Judgment: Judgment normal.      UC Treatments / Results  Labs (all labs ordered are listed, but only abnormal results are displayed) Labs Reviewed - No data to display  EKG   Radiology No results found.  Procedures Procedures (including critical care time)  Medications Ordered in UC Medications  ipratropium-albuterol (DUONEB) 0.5-2.5 (3) MG/3ML nebulizer solution 3 mL (3 mLs Nebulization Given 07/02/23 1422)  methylPREDNISolone acetate (DEPO-MEDROL) injection 40 mg (40 mg Intramuscular Given 07/02/23 1423)  albuterol (PROVENTIL) (2.5 MG/3ML) 0.083% nebulizer solution 2.5 mg (2.5 mg Nebulization Given 07/02/23 1422)    Initial Impression / Assessment and Plan / UC Course  I have reviewed the triage vital signs and the nursing notes.  Pertinent labs & imaging results that were available during my care of the patient were reviewed by me and considered in my medical decision making (see chart for details).   1. Severe persistent asthma with acute exacerbation, respiratory distress, hypoxia Respiratory distress on arrival with audible wheezing and dyspnea with ambulating short distance back to urgent care room. Oxygen saturation initially 90% on room air, difficulty speaking in full sentences due to shortness of breath.  Duoneb and albuterol breathing treatments given with slight subjective improvement in shortness of breath.  DepoMedrol 40mg  IM given. Oxygen saturation lowered to 87-89% on room air, increases to 92% with deep breaths.  98% oxygen saturation while on  breathing treatment.  Recommend transport to ED via EMS for further workup and evaluation given lackluster clinical response to interventions for asthma exacerbation in urgent care.  Patient placed on 3L oxygen by nasal cannula and staying around 90-91% after 1 duoneb and 2 albuterol breathing treatments.   Discussed recommendations and risks of deferring ED visit with patient who expresses understanding and agreement with plan to go to closest ER for further workup.  Discharged in stable condition to the ER via EMS.  Final Clinical Impressions(s) / UC Diagnoses   Final diagnoses:  Hypoxia  Severe persistent asthma, unspecified whether complicated  Respiratory distress   Discharge Instructions   None     ED Prescriptions     Medication Sig Dispense Auth. Provider   albuterol (VENTOLIN HFA) 108 (90 Base) MCG/ACT inhaler  (Status: Discontinued) Inhale 2 puffs into the lungs every 6 (six) hours as needed for wheezing or shortness of breath. 8 g Reita May M, FNP   predniSONE (DELTASONE) 20 MG tablet  (Status: Discontinued) Take 2 tablets (40 mg total) by mouth daily with breakfast for 5 days. 10 tablet Carlisle Beers, FNP      PDMP not reviewed this encounter.   Carlisle Beers, [place] 07/02/23 1501

## 2023-07-02 NOTE — Telephone Encounter (Signed)
Called patient back and left detail message to call office back.

## 2023-07-02 NOTE — ED Notes (Signed)
EMS contacted to transport patient to ER for further evaluation. Oxygen saturation continues to remain low following neb treatments.

## 2023-07-03 NOTE — Progress Notes (Signed)
Subjective:  Patient ID: Victor Hamilton, male    DOB: February 22, 1961  Age: 62 y.o. MRN: [id number]  Chief Complaint  Patient presents with   Medical Management of Chronic Issues   Follow-up   Discussed the use of AI scribe software for clinical note transcription with the patient, who gave verbal consent to proceed.    HPI   Asthma Exacerbation: The patient, with a history of asthma and allergies, presented with shortness of breath that led to an urgent care visit. The patient reported feeling weak and having difficulty breathing at work, which prompted a visit at [facility] on 07/02/23. Despite nebulizer with duoneb, methylprednisolone and albuterol, the patient's oxygen saturation could not be raised above 89%. The urgent care advised the patient to go to the emergency room, where he spent the remainder of the evening. A chest x-ray was performed, but no abnormalities were found. The patient's oxygen saturation eventually stabilized at 98%. Reports associated dry cough and bilateral chest tightness associated with shortness of breath for the last 1 week. The patient is currently taking Breo Ellipta once daily and albuterol as needed for breathing, which he had run out of prior to the episode.   Hypertension: BP elevated this morning 148/82 Patient takes Enalapril 10 mg and had not taken his medication yet this morning. Patient's BP was elevated @ Urgent Care 149/82 and before he left it had went down to 127/79. Patient denies headaches  Hyperlipidemia: Last total chol - 138, HDL - 42, Trig - 107, LDL - 76. Patient takes Lipitor 10 mg @ bedtime. Patient is compliant with his medication and does not have any side effects.  Seasonal allergic rhinitis: Not currently taking any medications. He has noticed that he has had more symptoms with the recent frequent changes in weather in [place]. He admits that in the spring his allergies are worse and that after doing yard work he can  hardly breath.   CHF: Takes lasix 60 mg by mouth TWICE A DAY. He sees a specialist, Dr. Bing Matter for his heart. Patient denies chest pain.  OSA: Patient states that he uses a PEEP now, he used to use a CPAP. Patient reports good compliance and it helps him to get adequate rest.  Prediabetes: A1C 6.3%. Currently does not check his sugars. He had a leg wound on his left leg that has healed and he makes sure to check his feet daily. Patient is due for an eye exam. Currently not exercising due to shortness of breath. Patient is trying to eat a carb modified diet.   Morbid obesity: BMI 56.30  Patient has gained 5 pounds  since October. But down 7 pounds from last December. Currently not exercising due to shortness of breath. Patient is trying to eat a carb modified diet.  Fatigue:  Patient states that he works 10 hours/day, 5 days a week and with his recent asthma exacerbation and the change in the weather he seems more fatigued than usual.     07/04/2023    7:55 AM 05/28/2022   10:43 AM 01/19/2021    9:11 AM 01/17/2020    1:34 PM  Depression screen PHQ 2/9  Decreased Interest 0 0 0 0  Down, Depressed, Hopeless 0 0 0 0  PHQ - 2 Score 0 0 0 0  Altered sleeping 0     Tired, decreased energy 0     Change in appetite 0     Feeling bad or failure about  yourself  0     Trouble concentrating 0     Moving slowly or fidgety/restless 0     Suicidal thoughts 0     PHQ-9 Score 0     Difficult doing work/chores Not difficult at all           07/04/2023    7:54 AM  Fall Risk   Falls in the past year? 0  Number falls in past yr: 0  Injury with Fall? 0  Risk for fall due to : No Fall Risks  Follow up Falls evaluation completed    Patient Care Team: Renne Crigler, FNP as PCP - General (Family Medicine)   Review of Systems  Constitutional:  Positive for fatigue. Negative for chills, diaphoresis and fever.  HENT:  Negative for congestion, rhinorrhea, sinus pressure, sinus pain, sneezing  and sore throat.   Respiratory:  Positive for shortness of breath.   Cardiovascular:  Negative for chest pain.  Gastrointestinal:  Negative for abdominal pain, diarrhea, nausea and vomiting.  Genitourinary:  Negative for dysuria, frequency and urgency.  Skin:  Negative for rash and wound.  Neurological:  Negative for weakness, numbness and headaches.  Psychiatric/Behavioral:  Negative for dysphoric mood and sleep disturbance. The patient is not nervous/anxious.     Current Outpatient Medications on File Prior to Visit  Medication Sig Dispense Refill   AMERICAN GINSENG PO Take 500 mg by mouth daily.     aspirin EC 81 MG tablet Take 1 tablet (81 mg total) by mouth daily. Swallow whole. 90 tablet 3   atorvastatin (LIPITOR) 10 MG tablet Take 1 tablet (10 mg total) by mouth daily. 90 tablet 3   bacitracin 500 UNIT/GM ointment Apply 1 Application topically 2 (two) times daily. 28 g 0   furosemide (LASIX) 40 MG tablet Take 1.5 tablets (60 mg total) by mouth 2 (two) times daily. 90 tablet 0   lidocaine (LIDODERM) 5 % Place 1 patch onto the skin daily.     meloxicam (MOBIC) 15 MG tablet Take 15 mg by mouth daily as needed for pain.     Potassium Gluconate 2.5 MEQ TABS Take 1 tablet by mouth daily.     No current facility-administered medications on file prior to visit.   Past Medical History:  Diagnosis Date   Morbid obesity due to excess calories (HCC)    Obstructive sleep apnea (adult) (pediatric)    History reviewed. No pertinent surgical history.  Family History  Problem Relation Age of Onset   Transient ischemic attack Mother    Hypertension Father    Heart attack Father    Diabetes Father    Social History   Socioeconomic History   Marital status: Divorced    Spouse name: Not on file   Number of children: 1   Years of education: Not on file   Highest education level: Not on file  Occupational History   Not on file  Tobacco Use   Smoking status: Former    Types: Cigarettes    Smokeless tobacco: Never  Vaping Use   Vaping status: Never Used  Substance and Sexual Activity   Alcohol use: Not Currently   Drug use: Not Currently   Sexual activity: Not on file  Other Topics Concern   Not on file  Social History Narrative   Not on file   Social Drivers of Health   Financial Resource Strain: Not on file  Food Insecurity: Not on file  Transportation Needs: Not on file  Physical Activity: Not on file  Stress: Not on file  Social Connections: Not on file    Objective:  BP (!) 148/82 (BP Location: Right Arm, Patient Position: Sitting, Cuff Size: Large)   Pulse 78   Temp 98.6 F (37 C) (Temporal)   Resp 20   Ht 5\' 10"  (1.778 m)   Wt (!) 392 lb 6.4 oz (178 kg)   SpO2 96%   BMI 56.30 kg/m      07/04/2023    7:51 AM 07/02/2023    2:55 PM 07/02/2023    2:36 PM  BP/Weight  Systolic BP 148 149 127  Diastolic BP 82 82 79  Wt. (Lbs) 392.4    BMI 56.3 kg/m2      Physical Exam Constitutional:      General: He is not in acute distress.    Appearance: He is obese. He is not ill-appearing.  HENT:     Right Ear: Tympanic membrane normal.     Left Ear: Tympanic membrane normal.     Nose: Nose normal.     Mouth/Throat:     Mouth: Mucous membranes are moist.     Pharynx: No posterior oropharyngeal erythema.  Eyes:     Conjunctiva/sclera: Conjunctivae normal.  Neck:     Vascular: No carotid bruit.  Cardiovascular:     Rate and Rhythm: Normal rate and regular rhythm.     Heart sounds: Normal heart sounds.  Pulmonary:     Effort: Pulmonary effort is normal.     Breath sounds: Normal breath sounds.  Abdominal:     General: Bowel sounds are normal.     Palpations: Abdomen is soft.     Tenderness: There is no abdominal tenderness.  Musculoskeletal:     Cervical back: Normal range of motion.  Neurological:     Mental Status: He is alert. Mental status is at baseline.  Psychiatric:        Mood and Affect: Mood normal.        Behavior: Behavior  normal.      Lab Results  Component Value Date   WBC 9.6 05/28/2022   HGB 11.8 (L) 05/28/2022   HCT 39.0 05/28/2022   PLT 367 05/28/2022   GLUCOSE 106 (H) 05/13/2023   CHOL 138 09/24/2022   TRIG 107 09/24/2022   HDL 42 09/24/2022   LDLCALC 76 09/24/2022   ALT 15 09/24/2022   AST 17 09/24/2022   NA 141 05/13/2023   K 4.7 05/13/2023   CL 99 05/13/2023   CREATININE 0.90 05/13/2023   BUN 14 05/13/2023   CO2 25 05/13/2023   HGBA1C 6.3 (H) 05/28/2022      Assessment & Plan:    Moderate persistent asthma with acute exacerbation Assessment & Plan: Recent exacerbation with low oxygen saturation requiring urgent care visit. History of childhood asthma and allergies. Currently on Breo Ellipta. -Continue Breo Ellipta as prescribed. -Add AirSupra instead of albuterol inhaler -Consider obtaining a pulse oximeter for home monitoring. Advised patient to go to the ED if he has another episode of respiratory distress.   Orders: -     Fluticasone Furoate-Vilanterol; TAKE 1 INHALATION BY MOUTH ONCE A DAY  Dispense: 60 each; Refill: 1 -     Airsupra; Inhale 2 puffs into the lungs every 6 (six) hours as needed.  Dispense: 5.9 g; Refill: 2  Chronic diastolic congestive heart failure (HCC) Assessment & Plan: Managed by specialist Stable  On Lasix 60 mg by mouth TWICE A DAY Labs drawn  today Await labs/testing for assessment and recommendations    Orders: -     Enalapril Maleate; Take 1 tablet (10 mg total) by mouth daily.  Dispense: 30 tablet; Refill: 0  Primary hypertension Assessment & Plan: Elevated blood pressure noted during visit, patient had not taken medication prior to appointment. -Continue current antihypertensive regimen. -Get BP cuff and keep a BP log for 1 week and make appointment for a nurse visit to check his BP -Adjust medication if needed in 1 week  Orders: -     Lipid panel -     Comprehensive metabolic panel  Obstructive sleep apnea (adult)  (pediatric) Assessment & Plan: Well controlled -Patient uses PEEP  -managed by specialist    Seasonal allergic rhinitis, unspecified trigger Assessment & Plan: Recent asthma exacerbation, unsure of what triggered episode -Add cetirizine and Singulair for maintenance and prevention of flare-ups.  Orders: -     Fluticasone Propionate; Place 2 sprays into both nostrils daily.  Dispense: 16 g; Refill: 6 -     Cetirizine HCl; Take 1 tablet (10 mg total) by mouth daily.  Dispense: 30 tablet; Refill: 11 -     Montelukast Sodium; Take 1 tablet (10 mg total) by mouth at bedtime.  Dispense: 30 tablet; Refill: 3  Prediabetes Assessment & Plan: No current issues reported. -Continue monitoring blood glucose levels. -Order A1C to monitor glycemic control, Await labs/testing for assessment and recommendations -Continue daily foot  FU in 3 months   Orders: -     Hemoglobin A1c -     Microalbumin / creatinine urine ratio  Fatigue due to excessive exertion, initial encounter Assessment & Plan: Labs drawn Await labs/testing for assessment and recommendations   Orders: -     TSH -     VITAMIN D 25 Hydroxy (Vit-D Deficiency, Fractures)  Class 3 drug-induced obesity with serious comorbidity and body mass index (BMI) of 50.0 to 59.9 in adult [facility]) Assessment & Plan: Not at goal. Current BMI 56.30 -Patient not able to exercise -Discuss nutrition referral at next appointment for help with diet      Meds ordered this encounter  Medications   enalapril (VASOTEC) 10 MG tablet    Sig: Take 1 tablet (10 mg total) by mouth daily.    Dispense:  30 tablet    Refill:  0    Patient needs an appointment.   fluticasone furoate-vilanterol (BREO ELLIPTA) 100-25 MCG/ACT AEPB    Sig: TAKE 1 INHALATION BY MOUTH ONCE A DAY    Dispense:  60 each    Refill:  1   fluticasone (FLONASE) 50 MCG/ACT nasal spray    Sig: Place 2 sprays into both nostrils daily.    Dispense:  16 g    Refill:  6    cetirizine (ZYRTEC) 10 MG tablet    Sig: Take 1 tablet (10 mg total) by mouth daily.    Dispense:  30 tablet    Refill:  11   montelukast (SINGULAIR) 10 MG tablet    Sig: Take 1 tablet (10 mg total) by mouth at bedtime.    Dispense:  30 tablet    Refill:  3   DISCONTD: Albuterol-Budesonide (AIRSUPRA) 90-80 MCG/ACT AERO    Sig: Inhale 2 puffs into the lungs every 4 (four) hours as needed.    Dispense:  5.9 g    Refill:  2   Albuterol-Budesonide (AIRSUPRA) 90-80 MCG/ACT AERO    Sig: Inhale 2 puffs into the lungs every 6 (six) hours as  needed.    Dispense:  5.9 g    Refill:  2    Orders Placed This Encounter  Procedures   Lipid Panel   Comprehensive metabolic panel   Hemoglobin A1c   Microalbumin/Creatinine Ratio, Urine   TSH   Vitamin D, 25-hydroxy     Follow-up: Return in about 3 months (around 10/02/2023) for chronic, 1-2 weeks for BP check with nurse.   Clayborn Bigness I Leal-Borjas,acting as a scribe for Renne Crigler, FNP.,have documented all relevant documentation on the behalf of Renne Crigler, FNP,as directed by  Renne Crigler, FNP while in the presence of Renne Crigler, FNP.   An After Visit Summary was printed and given to the patient.  Total time spent on today's visit was 49 minutes, including both face-to-face time and nonface-to-face time personally spent on review of chart (labs and imaging), discussing labs and goals, discussing further work-up, treatment options, referrals to specialist if needed, reviewing outside records if pertinent, answering patient's questions, and coordinating care.    Lajuana Matte, FNP [facility] [phone]

## 2023-07-04 ENCOUNTER — Encounter: Payer: Self-pay | Admitting: Family Medicine

## 2023-07-04 ENCOUNTER — Ambulatory Visit: Payer: BLUE CROSS/BLUE SHIELD | Admitting: Family Medicine

## 2023-07-04 VITALS — BP 148/82 | HR 78 | Temp 98.6°F | Resp 20 | Ht 70.0 in | Wt 392.4 lb

## 2023-07-04 DIAGNOSIS — G4733 Obstructive sleep apnea (adult) (pediatric): Secondary | ICD-10-CM | POA: Diagnosis not present

## 2023-07-04 DIAGNOSIS — J4541 Moderate persistent asthma with (acute) exacerbation: Secondary | ICD-10-CM | POA: Diagnosis not present

## 2023-07-04 DIAGNOSIS — T733XXA Exhaustion due to excessive exertion, initial encounter: Secondary | ICD-10-CM | POA: Insufficient documentation

## 2023-07-04 DIAGNOSIS — R7303 Prediabetes: Secondary | ICD-10-CM

## 2023-07-04 DIAGNOSIS — E661 Drug-induced obesity: Secondary | ICD-10-CM

## 2023-07-04 DIAGNOSIS — I5032 Chronic diastolic (congestive) heart failure: Secondary | ICD-10-CM | POA: Diagnosis not present

## 2023-07-04 DIAGNOSIS — I10 Essential (primary) hypertension: Secondary | ICD-10-CM | POA: Diagnosis not present

## 2023-07-04 DIAGNOSIS — Z6841 Body Mass Index (BMI) 40.0 and over, adult: Secondary | ICD-10-CM

## 2023-07-04 DIAGNOSIS — J302 Other seasonal allergic rhinitis: Secondary | ICD-10-CM | POA: Insufficient documentation

## 2023-07-04 DIAGNOSIS — E66813 Obesity, class 3: Secondary | ICD-10-CM | POA: Insufficient documentation

## 2023-07-04 MED ORDER — FLUTICASONE PROPIONATE 50 MCG/ACT NA SUSP: 2.0000 | Freq: Every day | NASAL | 6 refills | Status: AC

## 2023-07-04 MED ORDER — MONTELUKAST SODIUM 10 MG PO TABS: 10.0000 mg | ORAL_TABLET | Freq: Every day | ORAL | 3 refills | Status: DC

## 2023-07-04 MED ORDER — AIRSUPRA 90-80 MCG/ACT IN AERO: 2.0000 | INHALATION_SPRAY | RESPIRATORY_TRACT | 2 refills | Status: DC | PRN

## 2023-07-04 MED ORDER — FLUTICASONE FUROATE-VILANTEROL 100-25 MCG/ACT IN AEPB: INHALATION_SPRAY | RESPIRATORY_TRACT | 1 refills | Status: AC

## 2023-07-04 MED ORDER — AIRSUPRA 90-80 MCG/ACT IN AERO: 2.0000 | INHALATION_SPRAY | Freq: Four times a day (QID) | RESPIRATORY_TRACT | 2 refills | Status: AC | PRN

## 2023-07-04 MED ORDER — ENALAPRIL MALEATE 10 MG PO TABS: 10.0000 mg | ORAL_TABLET | Freq: Every day | ORAL | 0 refills | Status: DC

## 2023-07-04 MED ORDER — CETIRIZINE HCL 10 MG PO TABS: 10.0000 mg | ORAL_TABLET | Freq: Every day | ORAL | 11 refills | Status: AC

## 2023-07-04 NOTE — Assessment & Plan Note (Addendum)
Labs drawn Await labs/testing for assessment and recommendations.

## 2023-07-04 NOTE — Assessment & Plan Note (Signed)
Elevated blood pressure noted during visit, patient had not taken medication prior to appointment. -Continue current antihypertensive regimen. -Get BP cuff and keep a BP log for 1 week and make appointment for a nurse visit to check his BP -Adjust medication if needed in 1 week

## 2023-07-04 NOTE — Assessment & Plan Note (Addendum)
Managed by specialist Stable  On Lasix 60 mg by mouth TWICE A DAY Labs drawn today Await labs/testing for assessment and recommendations

## 2023-07-04 NOTE — Assessment & Plan Note (Signed)
Recent exacerbation with low oxygen saturation requiring urgent care visit. History of childhood asthma and allergies. Currently on Breo Ellipta. -Continue Breo Ellipta as prescribed. -Add AirSupra instead of albuterol inhaler -Consider obtaining a pulse oximeter for home monitoring. Advised patient to go to the ED if he has another episode of respiratory distress.

## 2023-07-04 NOTE — Assessment & Plan Note (Addendum)
No current issues reported. -Continue monitoring blood glucose levels. -Order A1C to monitor glycemic control, Await labs/testing for assessment and recommendations -Continue daily foot  FU in 3 months

## 2023-07-04 NOTE — Assessment & Plan Note (Signed)
Well controlled -Patient uses PEEP  -managed by specialist

## 2023-07-04 NOTE — Assessment & Plan Note (Signed)
Not at goal. Current BMI 56.30 -Patient not able to exercise -Discuss nutrition referral at next appointment for help with diet

## 2023-07-04 NOTE — Assessment & Plan Note (Addendum)
Recent exacerbation with low oxygen saturation requiring urgent care visit. History of childhood asthma and allergies. Currently on Breo Ellipta. -Continue Breo Ellipta as prescribed. -Use AirSupra instead of albuterol inhaler -Add cetirizine and Singulair for maintenance and prevention of flare-ups. -Consider obtaining a pulse oximeter for home monitoring. FU in 3 months

## 2023-07-04 NOTE — Assessment & Plan Note (Signed)
Recent asthma exacerbation, unsure of what triggered episode -Add cetirizine and Singulair for maintenance and prevention of flare-ups.

## 2023-07-06 ENCOUNTER — Other Ambulatory Visit: Payer: Self-pay | Admitting: Family Medicine

## 2023-07-06 DIAGNOSIS — E559 Vitamin D deficiency, unspecified: Secondary | ICD-10-CM

## 2023-07-06 LAB — COMPREHENSIVE METABOLIC PANEL
ALT: 19 [IU]/L (ref 0–44)
AST: 18 [IU]/L (ref 0–40)
Albumin: 4.3 g/dL (ref 3.9–4.9)
Alkaline Phosphatase: 99 [IU]/L (ref 44–121)
BUN/Creatinine Ratio: 14 (ref 10–24)
BUN: 13 mg/dL (ref 8–27)
Bilirubin Total: 0.3 mg/dL (ref 0.0–1.2)
CO2: 27 mmol/L (ref 20–29)
Calcium: 9.5 mg/dL (ref 8.6–10.2)
Chloride: 97 mmol/L (ref 96–106)
Creatinine, Ser: 0.93 mg/dL (ref 0.76–1.27)
Globulin, Total: 2.5 g/dL (ref 1.5–4.5)
Glucose: 111 mg/dL — ABNORMAL HIGH (ref 70–99)
Potassium: 5 mmol/L (ref 3.5–5.2)
Sodium: 141 mmol/L (ref 134–144)
Total Protein: 6.8 g/dL (ref 6.0–8.5)
eGFR: 93 mL/min/{1.73_m2} (ref >59)

## 2023-07-06 LAB — LIPID PANEL
Chol/HDL Ratio: 3.1 {ratio} (ref 0.0–5.0)
Cholesterol, Total: 128 mg/dL (ref 100–199)
HDL: 41 mg/dL (ref >39)
LDL Chol Calc (NIH): 69 mg/dL (ref 0–99)
Triglycerides: 92 mg/dL (ref 0–149)
VLDL Cholesterol Cal: 18 mg/dL (ref 5–40)

## 2023-07-06 LAB — TSH: TSH: 2.76 u[IU]/mL (ref 0.450–4.500)

## 2023-07-06 LAB — MICROALBUMIN / CREATININE URINE RATIO
Creatinine, Urine: 79.7 mg/dL
Microalb/Creat Ratio: 7 mg/g{creat} (ref 0–29)
Microalbumin, Urine: 5.9 ug/mL

## 2023-07-06 LAB — HEMOGLOBIN A1C
Est. average glucose Bld gHb Est-mCnc: 148 mg/dL
Hgb A1c MFr Bld: 6.8 % — ABNORMAL HIGH (ref 4.8–5.6)

## 2023-07-06 LAB — VITAMIN D 25 HYDROXY (VIT D DEFICIENCY, FRACTURES): Vit D, 25-Hydroxy: 9.4 ng/mL — ABNORMAL LOW (ref 30.0–100.0)

## 2023-07-11 ENCOUNTER — Other Ambulatory Visit: Payer: Self-pay | Admitting: Cardiology

## 2023-07-14 ENCOUNTER — Ambulatory Visit: Payer: BLUE CROSS/BLUE SHIELD

## 2023-07-14 ENCOUNTER — Encounter: Payer: Self-pay | Admitting: Family Medicine

## 2023-07-14 ENCOUNTER — Ambulatory Visit: Payer: BLUE CROSS/BLUE SHIELD | Admitting: Family Medicine

## 2023-07-14 VITALS — BP 150/88 | HR 80 | Temp 97.6°F | Resp 15 | Ht 70.0 in | Wt 386.0 lb

## 2023-07-14 DIAGNOSIS — I5032 Chronic diastolic (congestive) heart failure: Secondary | ICD-10-CM

## 2023-07-14 DIAGNOSIS — E1169 Type 2 diabetes mellitus with other specified complication: Secondary | ICD-10-CM

## 2023-07-14 DIAGNOSIS — E1159 Type 2 diabetes mellitus with other circulatory complications: Secondary | ICD-10-CM | POA: Diagnosis not present

## 2023-07-14 DIAGNOSIS — E785 Hyperlipidemia, unspecified: Secondary | ICD-10-CM

## 2023-07-14 DIAGNOSIS — I152 Hypertension secondary to endocrine disorders: Secondary | ICD-10-CM

## 2023-07-14 DIAGNOSIS — E559 Vitamin D deficiency, unspecified: Secondary | ICD-10-CM | POA: Diagnosis not present

## 2023-07-14 MED ORDER — BLOOD GLUCOSE MONITORING SUPPL DEVI: 1.0000 | Freq: Three times a day (TID) | 0 refills | Status: AC

## 2023-07-14 MED ORDER — LANCETS MISC. MISC: 1.0000 | Freq: Three times a day (TID) | 0 refills | Status: AC

## 2023-07-14 MED ORDER — AMLODIPINE BESYLATE 5 MG PO TABS: 5.0000 mg | ORAL_TABLET | Freq: Every day | ORAL | 2 refills | Status: DC

## 2023-07-14 MED ORDER — BLOOD GLUCOSE TEST VI STRP: 1.0000 | ORAL_STRIP | Freq: Three times a day (TID) | 0 refills | Status: AC

## 2023-07-14 MED ORDER — OZEMPIC (0.25 OR 0.5 MG/DOSE) 2 MG/3ML ~~LOC~~ SOPN: 0.5000 mg | PEN_INJECTOR | SUBCUTANEOUS | 0 refills | Status: DC

## 2023-07-14 MED ORDER — METFORMIN HCL 500 MG PO TABS: 500.0000 mg | ORAL_TABLET | Freq: Every day | ORAL | 3 refills | Status: AC

## 2023-07-14 MED ORDER — LANCET DEVICE MISC: 1.0000 | Freq: Three times a day (TID) | 0 refills | Status: AC

## 2023-07-14 NOTE — Assessment & Plan Note (Signed)
Severe deficiency with a level of 9 (normal range 30-100). -Start Vitamin D 50,000 units once a week for 12 weeks.

## 2023-07-14 NOTE — Assessment & Plan Note (Signed)
Managed by specialist Stable  On Lasix 60 mg by mouth TWICE A DAY

## 2023-07-14 NOTE — Assessment & Plan Note (Addendum)
Uncontrolled blood pressure with inconsistent medication adherence. Patient was previously on Enalapril and Lasix, but has not filled his prescription for Enalapril. -Resume Enalapril as previously prescribed. -Add Amlodipine at a low dose. -Continue Lasix 60mg  twice daily. -Check blood pressure in 4 weeks. BP Readings from Last 3 Encounters:  07/14/23 (!) 150/88  07/04/23 (!) 148/82  07/02/23 (!) 149/82    A1c of 6.8, indicating new onset diabetes. Patient has a family history of diabetes. -Start Metformin 500mg  once daily with breakfast. -Start Ozempic 0.25mg  subcutaneously once a week for 4 weeks, then increase to 0.5mg  once a week for 4 weeks. -Provide patient with a glucose monitor kit to track blood glucose levels. -Plan to increase Ozempic to 1mg  after 8 weeks if well-tolerated. -Follow up in 4 weeks to assess response to new medications. -Recheck labs in 3 months.

## 2023-07-14 NOTE — Progress Notes (Addendum)
Subjective:  Patient ID: Victor Hamilton, male    DOB: [birth year]  Age: 62 y.o. MRN: [id number]  Discussed the use of AI scribe software for clinical note transcription with the patient, who gave verbal consent to proceed.    Chief Complaint  Patient presents with   Hypertension    HPI The patient, with a history of coronary artery disease and congestive heart failure, presents with high blood pressure and newly diagnosed diabetes.   Hypertension: 150/88 rt arm Patient is here for blood pressure follow up. His blood pressure at home has been 140-158 and 71-92 on left arm. They report having been off their blood pressure medication (enalapril), which was previously prescribed by their cardiologist.  He denies chest pain, SOB, dizziness, and headache.  Diabetes Mellitus: A1C  6.8% Patient presents with new onset of Type 2 diabetes. Symptoms: increase appetite. Symptoms have waxed and waned but are worse overall and been the same . Patient denies foot ulcerations, hyperglycemia, nausea, polydipsia, visual disturbances, vomitting, and weight loss.  Evaluation to date has been included: hemoglobin A1C.  Home sugars:  has not started checking, needs glucose monitoring kit . Treatment to date: low cholesterol diet which has been ineffective.    Vitamin D deficiency: Vitamin D - 9.4 The patient has been experiencing fatigue, which could be due to low vitamin D levels.   CHF: They have been taking Lasix for their heart condition. The patient also mentions that they have been feeling drained by the end of the day, which could be due to their uncontrolled blood pressure and newly diagnosed diabetes.       07/04/2023    7:55 AM 05/28/2022   10:43 AM 01/19/2021    9:11 AM 01/17/2020    1:34 PM  Depression screen PHQ 2/9  Decreased Interest 0 0 0 0  Down, Depressed, Hopeless 0 0 0 0  PHQ - 2 Score 0 0 0 0  Altered sleeping 0     Tired, decreased energy 0     Change in appetite 0     Feeling  bad or failure about yourself  0     Trouble concentrating 0     Moving slowly or fidgety/restless 0     Suicidal thoughts 0     PHQ-9 Score 0     Difficult doing work/chores Not difficult at all           07/04/2023    7:54 AM  Fall Risk   Falls in the past year? 0  Number falls in past yr: 0  Injury with Fall? 0  Risk for fall due to : No Fall Risks  Follow up Falls evaluation completed    Patient Care Team: Renne Crigler, FNP as PCP - General (Family Medicine)   Review of Systems  Constitutional:  Negative for chills, fatigue, fever and unexpected weight change.  HENT:  Negative for congestion, ear pain, sinus pain and sore throat.   Respiratory:  Positive for shortness of breath and wheezing.   Cardiovascular:  Negative for chest pain and palpitations.  Gastrointestinal:  Negative for abdominal pain, blood in stool, constipation, diarrhea, nausea and vomiting.  Endocrine: Negative for polydipsia.  Genitourinary:  Negative for dysuria.  Musculoskeletal:  Negative for back pain.  Skin:  Negative for rash.  Neurological:  Negative for headaches.    Current Outpatient Medications on File Prior to Visit  Medication Sig Dispense Refill   albuterol (VENTOLIN HFA) 108 (90 Base) MCG/ACT inhaler  SMARTSIG:2 Puff(s) By Mouth Every 6 Hours PRN     Albuterol-Budesonide (AIRSUPRA) 90-80 MCG/ACT AERO Inhale 2 puffs into the lungs every 6 (six) hours as needed. 5.9 g 2   AMERICAN GINSENG PO Take 500 mg by mouth daily.     aspirin EC 81 MG tablet Take 1 tablet (81 mg total) by mouth daily. Swallow whole. 90 tablet 3   atorvastatin (LIPITOR) 10 MG tablet Take 1 tablet (10 mg total) by mouth daily. 90 tablet 2   bacitracin 500 UNIT/GM ointment Apply 1 Application topically 2 (two) times daily. 28 g 0   cetirizine (ZYRTEC) 10 MG tablet Take 1 tablet (10 mg total) by mouth daily. 30 tablet 11   enalapril (VASOTEC) 10 MG tablet Take 1 tablet (10 mg total) by mouth daily. 30 tablet 0    fluticasone (FLONASE) 50 MCG/ACT nasal spray Place 2 sprays into both nostrils daily. 16 g 6   fluticasone furoate-vilanterol (BREO ELLIPTA) 100-25 MCG/ACT AEPB TAKE 1 INHALATION BY MOUTH ONCE A DAY 60 each 1   furosemide (LASIX) 40 MG tablet Take 1.5 tablets (60 mg total) by mouth 2 (two) times daily. 90 tablet 0   lidocaine (LIDODERM) 5 % Place 1 patch onto the skin daily.     meloxicam (MOBIC) 15 MG tablet Take 15 mg by mouth daily as needed for pain.     montelukast (SINGULAIR) 10 MG tablet Take 1 tablet (10 mg total) by mouth at bedtime. 30 tablet 3   Potassium Gluconate 2.5 MEQ TABS Take 1 tablet by mouth daily.     No current facility-administered medications on file prior to visit.   Past Medical History:  Diagnosis Date   Morbid obesity due to excess calories (HCC)    Obstructive sleep apnea (adult) (pediatric)    History reviewed. No pertinent surgical history.  Family History  Problem Relation Age of Onset   Transient ischemic attack Mother    Hypertension Father    Heart attack Father    Diabetes Father    Social History   Socioeconomic History   Marital status: Divorced    Spouse name: Not on file   Number of children: 1   Years of education: Not on file   Highest education level: Not on file  Occupational History   Not on file  Tobacco Use   Smoking status: Former    Types: Cigarettes   Smokeless tobacco: Never  Vaping Use   Vaping status: Never Used  Substance and Sexual Activity   Alcohol use: Not Currently   Drug use: Not Currently   Sexual activity: Not on file  Other Topics Concern   Not on file  Social History Narrative   Not on file   Social Drivers of Health   Financial Resource Strain: Not on file  Food Insecurity: Not on file  Transportation Needs: Not on file  Physical Activity: Not on file  Stress: Not on file  Social Connections: Not on file    Objective:  BP (!) 150/88 (BP Location: Right Arm, Patient Position: Sitting, Cuff Size:  Large)   Pulse 80   Temp 97.6 F (36.4 C)   Resp 15   Ht 5\' 10"  (1.778 m)   Wt (!) 386 lb (175.1 kg)   SpO2 96%   BMI 55.39 kg/m      07/14/2023   11:03 AM 07/14/2023   10:10 AM 07/14/2023   10:06 AM  BP/Weight  Systolic BP 150 150 130  Diastolic BP 88  80 80  Wt. (Lbs)   386  BMI   55.39 kg/m2    Physical Exam Constitutional:      General: He is not in acute distress.    Appearance: Normal appearance. He is obese. He is not ill-appearing.  Neck:     Vascular: No carotid bruit.  Cardiovascular:     Rate and Rhythm: Normal rate and regular rhythm.     Heart sounds: Normal heart sounds.  Pulmonary:     Effort: Pulmonary effort is normal.     Breath sounds: Normal breath sounds.  Abdominal:     General: Bowel sounds are normal.     Palpations: Abdomen is soft.     Tenderness: There is no abdominal tenderness.  Neurological:     Mental Status: He is alert. Mental status is at baseline.  Psychiatric:        Mood and Affect: Mood normal.        Behavior: Behavior normal.     Diabetic Foot Exam - Simple   No data filed      Lab Results  Component Value Date   WBC 9.6 05/28/2022   HGB 11.8 (L) 05/28/2022   HCT 39.0 05/28/2022   PLT 367 05/28/2022   GLUCOSE 111 (H) 07/04/2023   CHOL 128 07/04/2023   TRIG 92 07/04/2023   HDL 41 07/04/2023   LDLCALC 69 07/04/2023   ALT 19 07/04/2023   AST 18 07/04/2023   NA 141 07/04/2023   K 5.0 07/04/2023   CL 97 07/04/2023   CREATININE 0.93 07/04/2023   BUN 13 07/04/2023   CO2 27 07/04/2023   TSH 2.760 07/04/2023   HGBA1C 6.8 (H) 07/04/2023      Assessment & Plan:    Hypertension associated with diabetes (HCC) Assessment & Plan: Uncontrolled blood pressure with inconsistent medication adherence. Patient was previously on Enalapril and Lasix, but has not filled his prescription for Enalapril. -Resume Enalapril as previously prescribed. -Add Amlodipine at a low dose. -Continue Lasix 60mg  twice daily. -Check  blood pressure in 4 weeks. BP Readings from Last 3 Encounters:  07/14/23 (!) 150/88  07/04/23 (!) 148/82  07/02/23 (!) 149/82    A1c of 6.8, indicating new onset diabetes. Patient has a family history of diabetes. -Start Metformin 500mg  once daily with breakfast. -Start Ozempic 0.25mg  subcutaneously once a week for 4 weeks, then increase to 0.5mg  once a week for 4 weeks. -Provide patient with a glucose monitor kit to track blood glucose levels. -Plan to increase Ozempic to 1mg  after 8 weeks if well-tolerated. -Follow up in 4 weeks to assess response to new medications. -Recheck labs in 3 months.  Orders: -     amLODIPine Besylate; Take 1 tablet (5 mg total) by mouth daily.  Dispense: 90 tablet; Refill: 2 -     metFORMIN HCl; Take 1 tablet (500 mg total) by mouth daily with breakfast.  Dispense: 90 tablet; Refill: 3 -     Ozempic (0.25 or 0.5 MG/DOSE); Inject 0.5 mg into the skin once a week.  Dispense: 3 mL; Refill: 0 -     Blood Glucose Monitoring Suppl; 1 each by Does not apply route in the morning, at noon, and at bedtime. May substitute to any manufacturer covered by patient's insurance.  Dispense: 1 each; Refill: 0 -     Blood Glucose Test; 1 each by In Vitro route in the morning, at noon, and at bedtime. May substitute to any manufacturer covered by patient's insurance.  Dispense:  90 each; Refill: 0 -     Lancet Device; 1 each by Does not apply route in the morning, at noon, and at bedtime. May substitute to any manufacturer covered by patient's insurance.  Dispense: 1 each; Refill: 0 -     Lancets Misc.; 1 each by Does not apply route in the morning, at noon, and at bedtime. May substitute to any manufacturer covered by patient's insurance.  Dispense: 100 each; Refill: 0  Hyperlipidemia associated with type 2 diabetes mellitus (HCC) -     metFORMIN HCl; Take 1 tablet (500 mg total) by mouth daily with breakfast.  Dispense: 90 tablet; Refill: 3 -     Ozempic (0.25 or 0.5 MG/DOSE);  Inject 0.5 mg into the skin once a week.  Dispense: 3 mL; Refill: 0 -     Blood Glucose Monitoring Suppl; 1 each by Does not apply route in the morning, at noon, and at bedtime. May substitute to any manufacturer covered by patient's insurance.  Dispense: 1 each; Refill: 0 -     Blood Glucose Test; 1 each by In Vitro route in the morning, at noon, and at bedtime. May substitute to any manufacturer covered by patient's insurance.  Dispense: 90 each; Refill: 0 -     Lancet Device; 1 each by Does not apply route in the morning, at noon, and at bedtime. May substitute to any manufacturer covered by patient's insurance.  Dispense: 1 each; Refill: 0 -     Lancets Misc.; 1 each by Does not apply route in the morning, at noon, and at bedtime. May substitute to any manufacturer covered by patient's insurance.  Dispense: 100 each; Refill: 0  Vitamin D deficiency Assessment & Plan: Severe deficiency with a level of 9 (normal range 30-100). -Start Vitamin D 50,000 units once a week for 12 weeks.    Chronic diastolic congestive heart failure (HCC) Assessment & Plan: Managed by specialist St[facility]  On Lasix 60 mg by mouth TWICE A DAY        Meds ordered this encounter  Medications   amLODipine (NORVASC) 5 MG tablet    Sig: Take 1 tablet (5 mg total) by mouth daily.    Dispense:  90 tablet    Refill:  2   metFORMIN (GLUCOPHAGE) 500 MG tablet    Sig: Take 1 tablet (500 mg total) by mouth daily with breakfast.    Dispense:  90 tablet    Refill:  3   Semaglutide,0.25 or 0.5MG /DOS, (OZEMPIC, 0.25 OR 0.5 MG/DOSE,) 2 MG/3ML SOPN    Sig: Inject 0.5 mg into the skin once a week.    Dispense:  3 mL    Refill:  0   Blood Glucose Monitoring Suppl DEVI    Sig: 1 each by Does not apply route in the morning, at noon, and at bedtime. May substitute to any manufacturer covered by patient's insurance.    Dispense:  1 each    Refill:  0   Glucose Blood (BLOOD GLUCOSE TEST STRIPS) STRP    Sig: 1 each by In  Vitro route in the morning, at noon, and at bedtime. May substitute to any manufacturer covered by patient's insurance.    Dispense:  90 each    Refill:  0   Lancet Device MISC    Sig: 1 each by Does not apply route in the morning, at noon, and at bedtime. May substitute to any manufacturer covered by patient's insurance.    Dispense:  1 each  Refill:  0   Lancets Misc. MISC    Sig: 1 each by Does not apply route in the morning, at noon, and at bedtime. May substitute to any manufacturer covered by patient's insurance.    Dispense:  100 each    Refill:  0    No orders of the defined types were placed in this encounter.    Follow-up: Return in about 4 weeks (around 08/11/2023) for med check, DM 2.   I,Marla I Leal-Borjas,acting as a scribe for Renne Crigler, FNP.,have documented all relevant documentation on the behalf of Renne Crigler, FNP,as directed by  Renne Crigler, FNP while in the presence of Renne Crigler, FNP.   An After Visit Summary was printed and given to the patient.  Total time spent on today's visit was 42 minutes, including both face-to-face time and nonface-to-face time personally spent on review of chart (labs and imaging), discussing labs and goals, discussing further work-up, treatment options, referrals to specialist if needed, reviewing outside records if pertinent, answering patient's questions, and coordinating care.    Lajuana Matte, FNP [facility] [phone]

## 2023-07-21 ENCOUNTER — Other Ambulatory Visit: Payer: Self-pay

## 2023-07-21 DIAGNOSIS — I5032 Chronic diastolic (congestive) heart failure: Secondary | ICD-10-CM

## 2023-07-21 DIAGNOSIS — E559 Vitamin D deficiency, unspecified: Secondary | ICD-10-CM

## 2023-07-21 MED ORDER — VITAMIN D (ERGOCALCIFEROL) 1.25 MG (50000 UNIT) PO CAPS: 50000.0000 [IU] | ORAL_CAPSULE | ORAL | 0 refills | Status: AC

## 2023-07-21 MED ORDER — ENALAPRIL MALEATE 10 MG PO TABS: 10.0000 mg | ORAL_TABLET | Freq: Every day | ORAL | 0 refills | Status: DC

## 2023-07-21 NOTE — Telephone Encounter (Signed)
Called patient, he stated that he need his enalapril 10 mg sent in as 90 day so insurance will cover it, also need vitamin D sent to pharmacy they didn't have a rx for it.  Sent both medication to request pharmacy and also gave patient fax number for [employer] paperwork to be faxed to office

## 2023-07-24 ENCOUNTER — Telehealth: Payer: Self-pay

## 2023-07-24 NOTE — Telephone Encounter (Signed)
 [employer]: CERTIFICATION OF HEALTHCARE PROVIDER FORM

## 2023-08-04 NOTE — Telephone Encounter (Signed)
 Left message for patient that forms are ready to be picked up and there is a section the he needs to fill out before the can be faxed

## 2023-08-05 NOTE — Telephone Encounter (Signed)
 Victor Hamilton verbally notified. Victor Hamilton is going to fax the forms.   Copied from CRM [id number]. Topic: General - Other >> Aug 04, 2023 12:21 PM Victor Hamilton wrote: Reason for CRM: Patient received a message from [facility] regarding the section of the forms that need to be completed by him, he wants to let her know he already spoke to [employer] and they said it is okay for the forms to be faxed without the section being completed, they just needed the medical part filled out. Okay to fax forms.

## 2023-08-14 ENCOUNTER — Encounter: Payer: Self-pay | Admitting: Family Medicine

## 2023-08-14 ENCOUNTER — Ambulatory Visit: Payer: BLUE CROSS/BLUE SHIELD | Admitting: Family Medicine

## 2023-08-14 VITALS — BP 138/72 | HR 88 | Temp 97.6°F | Ht 70.0 in | Wt 389.0 lb

## 2023-08-14 DIAGNOSIS — B351 Tinea unguium: Secondary | ICD-10-CM

## 2023-08-14 DIAGNOSIS — E1159 Type 2 diabetes mellitus with other circulatory complications: Secondary | ICD-10-CM | POA: Diagnosis not present

## 2023-08-14 DIAGNOSIS — E1169 Type 2 diabetes mellitus with other specified complication: Secondary | ICD-10-CM

## 2023-08-14 DIAGNOSIS — I152 Hypertension secondary to endocrine disorders: Secondary | ICD-10-CM | POA: Diagnosis not present

## 2023-08-14 MED ORDER — AMLODIPINE BESYLATE 10 MG PO TABS: 10.0000 mg | ORAL_TABLET | Freq: Every day | ORAL | 1 refills | Status: AC

## 2023-08-14 MED ORDER — OZEMPIC (0.25 OR 0.5 MG/DOSE) 2 MG/3ML ~~LOC~~ SOPN: 1.0000 mg | PEN_INJECTOR | SUBCUTANEOUS | 0 refills | Status: DC

## 2023-08-14 NOTE — Progress Notes (Unsigned)
Subjective:  Patient ID: Victor Hamilton, male    DOB: 11-09-1960  Age: 63 y.o. MRN: [id number]  Chief Complaint  Patient presents with   Diabetes    Diabetes Mellitus: A1C  6.8% Patient is here for a 4 week follow up after starting Ozempic 0.25 mg once weekly injection.Patient presents with new onset of Type 2 diabetes. Symptoms: increase appetite. Symptoms have waxed and waned but are worse overall and been the same. Patient denies foot ulcerations, hyperglycemia, nausea, polydipsia, visual disturbances, vomitting, and weight loss.  Evaluation to date has been included: hemoglobin A1C.  Home sugars: has not started checking, needs glucose monitoring kit. Treatment to date: low cholesterol diet which has been ineffective. He states that his sugars fasting in the mornings have been running 110-113.        07/04/2023    7:55 AM 05/28/2022   10:43 AM 01/19/2021    9:11 AM 01/17/2020    1:34 PM  Depression screen PHQ 2/9  Decreased Interest 0 0 0 0  Down, Depressed, Hopeless 0 0 0 0  PHQ - 2 Score 0 0 0 0  Altered sleeping 0     Tired, decreased energy 0     Change in appetite 0     Feeling bad or failure about yourself  0     Trouble concentrating 0     Moving slowly or fidgety/restless 0     Suicidal thoughts 0     PHQ-9 Score 0     Difficult doing work/chores Not difficult at all           07/04/2023    7:54 AM  Fall Risk   Falls in the past year? 0  Number falls in past yr: 0  Injury with Fall? 0  Risk for fall due to : No Fall Risks  Follow up Falls evaluation completed    Patient Care Team: Renne Crigler, FNP as PCP - General (Family Medicine)   Review of Systems  Constitutional:  Negative for appetite change, fatigue and fever.  HENT:  Negative for congestion, ear pain, sinus pressure and sore throat.   Respiratory:  Negative for cough, chest tightness, shortness of breath and wheezing.   Cardiovascular:  Negative for chest pain and palpitations.   Gastrointestinal:  Negative for abdominal pain, constipation, diarrhea, nausea and vomiting.  Genitourinary:  Negative for dysuria and hematuria.  Musculoskeletal:  Negative for arthralgias, back pain, joint swelling and myalgias.  Skin:  Negative for rash.  Neurological:  Negative for dizziness, weakness and headaches.  Psychiatric/Behavioral:  Negative for dysphoric mood. The patient is not nervous/anxious.     Current Outpatient Medications on File Prior to Visit  Medication Sig Dispense Refill   albuterol (VENTOLIN HFA) 108 (90 Base) MCG/ACT inhaler SMARTSIG:2 Puff(s) By Mouth Every 6 Hours PRN     Albuterol-Budesonide (AIRSUPRA) 90-80 MCG/ACT AERO Inhale 2 puffs into the lungs every 6 (six) hours as needed. 5.9 g 2   AMERICAN GINSENG PO Take 500 mg by mouth daily.     aspirin EC 81 MG tablet Take 1 tablet (81 mg total) by mouth daily. Swallow whole. 90 tablet 3   atorvastatin (LIPITOR) 10 MG tablet Take 1 tablet (10 mg total) by mouth daily. 90 tablet 2   bacitracin 500 UNIT/GM ointment Apply 1 Application topically 2 (two) times daily. 28 g 0   Blood Glucose Monitoring Suppl DEVI 1 each by Does not apply route in the morning, at noon, and at  bedtime. May substitute to any manufacturer covered by patient's insurance. 1 each 0   cetirizine (ZYRTEC) 10 MG tablet Take 1 tablet (10 mg total) by mouth daily. 30 tablet 11   enalapril (VASOTEC) 10 MG tablet Take 1 tablet (10 mg total) by mouth daily. 90 tablet 0   fluticasone (FLONASE) 50 MCG/ACT nasal spray Place 2 sprays into both nostrils daily. 16 g 6   fluticasone furoate-vilanterol (BREO ELLIPTA) 100-25 MCG/ACT AEPB TAKE 1 INHALATION BY MOUTH ONCE A DAY 60 each 1   furosemide (LASIX) 40 MG tablet Take 1.5 tablets (60 mg total) by mouth 2 (two) times daily. 90 tablet 0   lidocaine (LIDODERM) 5 % Place 1 patch onto the skin daily.     meloxicam (MOBIC) 15 MG tablet Take 15 mg by mouth daily as needed for pain.     metFORMIN (GLUCOPHAGE)  500 MG tablet Take 1 tablet (500 mg total) by mouth daily with breakfast. 90 tablet 3   montelukast (SINGULAIR) 10 MG tablet Take 1 tablet (10 mg total) by mouth at bedtime. 30 tablet 3   Potassium Gluconate 2.5 MEQ TABS Take 1 tablet by mouth daily.     Vitamin D, Ergocalciferol, (DRISDOL) 1.25 MG (50000 UNIT) CAPS capsule Take 1 capsule (50,000 Units total) by mouth every 7 (seven) days. 12 capsule 0   No current facility-administered medications on file prior to visit.   Past Medical History:  Diagnosis Date   Morbid obesity due to excess calories (HCC)    Obstructive sleep apnea (adult) (pediatric)    History reviewed. No pertinent surgical history.  Family History  Problem Relation Age of Onset   Transient ischemic attack Mother    Hypertension Father    Heart attack Father    Diabetes Father    Social History   Socioeconomic History   Marital status: Divorced    Spouse name: Not on file   Number of children: 1   Years of education: Not on file   Highest education level: Not on file  Occupational History   Not on file  Tobacco Use   Smoking status: Former    Types: Cigarettes   Smokeless tobacco: Never  Vaping Use   Vaping status: Never Used  Substance and Sexual Activity   Alcohol use: Not Currently   Drug use: Not Currently   Sexual activity: Not on file  Other Topics Concern   Not on file  Social History Narrative   Not on file   Social Drivers of Health   Financial Resource Strain: Not on file  Food Insecurity: Not on file  Transportation Needs: Not on file  Physical Activity: Not on file  Stress: Not on file  Social Connections: Not on file    Objective:  BP 138/72 (BP Location: Left Arm, Patient Position: Sitting)   Pulse 88   Temp 97.6 F (36.4 C) (Temporal)   Ht 5\' 10"  (1.778 m)   Wt (!) 389 lb (176.4 kg)   SpO2 97%   BMI 55.82 kg/m      08/14/2023    3:38 PM 07/14/2023   11:03 AM 07/14/2023   10:10 AM  BP/Weight  Systolic BP 138 150  150  Diastolic BP 72 88 80  Wt. (Lbs) 389    BMI 55.82 kg/m2      Physical Exam Constitutional:      General: He is not in acute distress.    Appearance: Normal appearance. He is obese. He is not ill-appearing.  HENT:  Head: Normocephalic.  Eyes:     Conjunctiva/sclera: Conjunctivae normal.  Neck:     Vascular: No carotid bruit.  Cardiovascular:     Rate and Rhythm: Normal rate and regular rhythm.     Heart sounds: Normal heart sounds. No murmur heard. Pulmonary:     Effort: Pulmonary effort is normal.     Breath sounds: Normal breath sounds. No wheezing.  Abdominal:     General: Bowel sounds are normal.     Palpations: Abdomen is soft.  Musculoskeletal:     Right lower leg: Edema present.     Left lower leg: Edema present.  Skin:    Findings: Erythema present.  Neurological:     Mental Status: He is alert. Mental status is at baseline.  Psychiatric:        Mood and Affect: Mood normal.        Behavior: Behavior normal.     Diabetic Foot Exam - Simple   Simple Foot Form Visual Inspection No deformities, no ulcerations, no other skin breakdown bilaterally: Yes See comments: Yes Sensation Testing Intact to touch and monofilament testing bilaterally: Yes Pulse Check Posterior Tibialis and Dorsalis pulse intact bilaterally: Yes Comments Fungal infection on toenails - needs referral to podiatrist      Lab Results  Component Value Date   WBC 9.6 05/28/2022   HGB 11.8 (L) 05/28/2022   HCT 39.0 05/28/2022   PLT 367 05/28/2022   GLUCOSE 111 (H) 07/04/2023   CHOL 128 07/04/2023   TRIG 92 07/04/2023   HDL 41 07/04/2023   LDLCALC 69 07/04/2023   ALT 19 07/04/2023   AST 18 07/04/2023   NA 141 07/04/2023   K 5.0 07/04/2023   CL 97 07/04/2023   CREATININE 0.93 07/04/2023   BUN 13 07/04/2023   CO2 27 07/04/2023   TSH 2.760 07/04/2023   HGBA1C 6.8 (H) 07/04/2023     Assessment & Plan:    Hypertension associated with diabetes (HCC) Assessment &  Plan: Elevated blood pressure (138/72) in the setting of inconsistent medication use due to insurance issues. Currently on Amlodipine and Enalapril. -Increase Amlodipine to 10mg  daily. -Check blood pressure in 2 weeks.  Orders: -     Ambulatory referral to Podiatry -     amLODIPine Besylate; Take 1 tablet (10 mg total) by mouth daily.  Dispense: 90 tablet; Refill: 1 -     Ozempic (0.25 or 0.5 MG/DOSE); Inject 1 mg into the skin once a week.  Dispense: 3 mL; Refill: 0  Fungal toenail infection -     Ambulatory referral to Podiatry     Meds ordered this encounter  Medications   amLODipine (NORVASC) 10 MG tablet    Sig: Take 1 tablet (10 mg total) by mouth daily.    Dispense:  90 tablet    Refill:  1   Semaglutide,0.25 or 0.5MG /DOS, (OZEMPIC, 0.25 OR 0.5 MG/DOSE,) 2 MG/3ML SOPN    Sig: Inject 1 mg into the skin once a week.    Dispense:  3 mL    Refill:  0    Orders Placed This Encounter  Procedures   Ambulatory referral to Podiatry     Follow-up: Return in about 2 months (around 10/12/2023) for BP recheck in 2 weeks with nurse, chronic.  An After Visit Summary was printed and given to the patient.  Lajuana Matte, FNP [facility] [phone]

## 2023-08-14 NOTE — Patient Instructions (Signed)
Amlodipine - Increase to 10 mg once a day

## 2023-08-15 DIAGNOSIS — B351 Tinea unguium: Secondary | ICD-10-CM | POA: Insufficient documentation

## 2023-08-15 NOTE — Assessment & Plan Note (Signed)
Elevated blood pressure (138/72) in the setting of inconsistent medication use due to insurance issues. Currently on Amlodipine and Enalapril. -Increase Amlodipine to 10mg  daily. -Check blood pressure in 2 weeks.

## 2023-08-15 NOTE — Assessment & Plan Note (Signed)
During diabetic foot exam.  -Toenail needs to be cut as they were wrapping around his toe. - Explained that medication for the toenail fungus can take over a year to completely get better and the medication is hard on  - Referral to podiatrist

## 2023-08-20 ENCOUNTER — Other Ambulatory Visit: Payer: Self-pay | Admitting: Family Medicine

## 2023-08-20 DIAGNOSIS — I152 Hypertension secondary to endocrine disorders: Secondary | ICD-10-CM

## 2023-08-28 ENCOUNTER — Ambulatory Visit: Payer: BLUE CROSS/BLUE SHIELD

## 2023-09-25 ENCOUNTER — Other Ambulatory Visit: Payer: Self-pay | Admitting: Cardiology

## 2023-09-25 DIAGNOSIS — I10 Essential (primary) hypertension: Secondary | ICD-10-CM

## 2023-10-07 ENCOUNTER — Ambulatory Visit: Payer: BLUE CROSS/BLUE SHIELD | Admitting: Family Medicine

## 2023-10-12 ENCOUNTER — Telehealth: Payer: Self-pay

## 2023-10-12 NOTE — Telephone Encounter (Signed)
 I left a message for the patient to call the office back to get his appointment rescheduled. Victor Hamilton has had a family emergency and will be out of the office on 10/13/2023. Appointment has been canceled. Waiting for the patient to call the office back.

## 2023-10-13 ENCOUNTER — Ambulatory Visit: Payer: BLUE CROSS/BLUE SHIELD | Admitting: Family Medicine

## 2023-10-13 ENCOUNTER — Other Ambulatory Visit: Payer: Self-pay | Admitting: Family Medicine

## 2023-10-13 DIAGNOSIS — I152 Hypertension secondary to endocrine disorders: Secondary | ICD-10-CM

## 2023-10-13 NOTE — Telephone Encounter (Signed)
 I left a message for the patient to call the office back to get his appointment rescheduled. [name] has had a family emergency and is out of the office today. Appointment has been canceled. Waiting for the patient to call the office back.

## 2023-10-16 ENCOUNTER — Other Ambulatory Visit: Payer: Self-pay | Admitting: Family Medicine

## 2023-10-16 DIAGNOSIS — E1169 Type 2 diabetes mellitus with other specified complication: Secondary | ICD-10-CM

## 2023-10-16 MED ORDER — OZEMPIC (2 MG/DOSE) 8 MG/3ML ~~LOC~~ SOPN: 2.0000 mg | PEN_INJECTOR | SUBCUTANEOUS | 2 refills | Status: DC

## 2023-10-21 ENCOUNTER — Other Ambulatory Visit: Payer: Self-pay | Admitting: Family Medicine

## 2023-10-21 DIAGNOSIS — I5032 Chronic diastolic (congestive) heart failure: Secondary | ICD-10-CM

## 2023-11-13 ENCOUNTER — Encounter: Payer: Self-pay | Admitting: Family Medicine

## 2023-11-13 ENCOUNTER — Ambulatory Visit: Admitting: Family Medicine

## 2023-11-13 VITALS — BP 118/60 | HR 86 | Temp 97.9°F | Resp 16 | Ht 70.0 in | Wt 362.8 lb

## 2023-11-13 DIAGNOSIS — K219 Gastro-esophageal reflux disease without esophagitis: Secondary | ICD-10-CM | POA: Insufficient documentation

## 2023-11-13 DIAGNOSIS — E785 Hyperlipidemia, unspecified: Secondary | ICD-10-CM

## 2023-11-13 DIAGNOSIS — E559 Vitamin D deficiency, unspecified: Secondary | ICD-10-CM

## 2023-11-13 DIAGNOSIS — L97929 Non-pressure chronic ulcer of unspecified part of left lower leg with unspecified severity: Secondary | ICD-10-CM

## 2023-11-13 DIAGNOSIS — I83029 Varicose veins of left lower extremity with ulcer of unspecified site: Secondary | ICD-10-CM

## 2023-11-13 DIAGNOSIS — R110 Nausea: Secondary | ICD-10-CM

## 2023-11-13 DIAGNOSIS — E1159 Type 2 diabetes mellitus with other circulatory complications: Secondary | ICD-10-CM | POA: Diagnosis not present

## 2023-11-13 DIAGNOSIS — I152 Hypertension secondary to endocrine disorders: Secondary | ICD-10-CM

## 2023-11-13 DIAGNOSIS — G4733 Obstructive sleep apnea (adult) (pediatric): Secondary | ICD-10-CM

## 2023-11-13 DIAGNOSIS — J4520 Mild intermittent asthma, uncomplicated: Secondary | ICD-10-CM

## 2023-11-13 DIAGNOSIS — E1169 Type 2 diabetes mellitus with other specified complication: Secondary | ICD-10-CM

## 2023-11-13 MED ORDER — OMEPRAZOLE 20 MG PO CPDR: 20.0000 mg | DELAYED_RELEASE_CAPSULE | Freq: Every day | ORAL | 3 refills | Status: AC

## 2023-11-13 MED ORDER — ONDANSETRON 4 MG PO TBDP: 4.0000 mg | ORAL_TABLET | Freq: Three times a day (TID) | ORAL | 0 refills | Status: AC | PRN

## 2023-11-13 NOTE — Assessment & Plan Note (Signed)
 Intermittent reflux symptoms with nausea and sour taste. Most likely due to side effect of Ozempic  when he eats a large meal or a not so healthy meal. Omeprazole  suggested for acid control, not recommended for long-term use. - Prescribe omeprazole  20 mg by mouth once daily for reflux management.

## 2023-11-13 NOTE — Assessment & Plan Note (Signed)
 BMI 52.06, not at goal - Discuss healthy eating: small meals, increased protein and fiber, reduced sugar, sodium, carbohydrates, trans fats. - Encourage increased fruits and vegetables, reduced soda, limited processed foods. - Offer dietitian referral if needed. - Encourage physical activity: walking, biking, swimming, dancing
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
## 2023-11-13 NOTE — Assessment & Plan Note (Signed)
 Severe deficiency with a level of 9 (normal range 30-100). -Took Vitamin D  50,000 units once a week for 12 weeks. - Recheck Vitamin D  level today, Await labs/testing for assessment and recommendations

## 2023-11-13 NOTE — Assessment & Plan Note (Signed)
 Intermittent nausea and occasional vomiting with sour taste. Zofran  offered for symptomatic relief. Side effect of Ozempic . Should subside since he is on the maintenance dose of 2 mg SQ once a week.  - Prescribe Zofran  (ondansetron ) orally disintegrating tablets for nausea and vomiting, to be taken every eight hours as needed.

## 2023-11-13 NOTE — Assessment & Plan Note (Signed)
 Well controlled -Patient uses PEEP  -managed by specialist

## 2023-11-13 NOTE — Assessment & Plan Note (Signed)
 No current ulcerations. Previous wounds have healed with no open wounds present. Some residual redness in the leg.

## 2023-11-13 NOTE — Assessment & Plan Note (Signed)
 Hypertension  Well controlled - Currently on Amlodipine  and Enalapril . BP Readings from Last 3 Encounters:  11/13/23 118/60  08/14/23 138/72  07/14/23 (!) 150/88

## 2023-11-13 NOTE — Patient Instructions (Signed)
 Mediterranean Diet A Mediterranean diet is based on the traditions of countries on the [employer]. It focuses on eating more: Fruits and vegetables. Whole grains, beans, nuts, and seeds. Heart-healthy fats. These are fats that are good for your heart. It involves eating less: Dairy. Meat and eggs. Processed foods with added sugar, salt, and fat. This type of diet can help prevent certain conditions. It can also improve outcomes if you have a long-term (chronic) disease, such as kidney or heart disease. What are tips for following this plan? Reading food labels Check packaged foods for: The serving size. For foods such as rice and pasta, the serving size is the amount of cooked product, not dry. The total fat. Avoid foods with saturated fat or trans fat. Added sugars, such as corn syrup. Shopping  Try to have a balanced diet. Buy a variety of foods, such as: Fresh fruits and vegetables. You may be able to get these from local farmers markets. You can also buy them frozen. Grains, beans, nuts, and seeds. Some of these can be bought in bulk. Fresh seafood. Poultry and eggs. Low-fat dairy products. Buy whole ingredients instead of foods that have already been packaged. If you can't get fresh seafood, buy precooked frozen shrimp or canned fish, such as tuna, salmon, or sardines. Stock your pantry so you always have certain foods on hand, such as olive oil, canned tuna, canned tomatoes, rice, pasta, and beans. Cooking Cook foods with extra-virgin olive oil instead of using butter or other vegetable oils. Have meat as a side dish. Have vegetables or grains as your main dish. This means having meat in small portions or adding small amounts of meat to foods like pasta or stew. Use beans or vegetables instead of meat in common dishes like chili or lasagna. Try out different cooking methods. Try roasting, broiling, steaming, and sauting vegetables. Add frozen vegetables to soups, stews,  pasta, or rice. Add nuts or seeds for added healthy fats and plant protein at each meal. You can add these to yogurt, salads, or vegetable dishes. Marinate fish or vegetables using olive oil, lemon juice, garlic, and fresh herbs. Meal planning Plan to eat a vegetarian meal one day each week. Try to work up to two vegetarian meals, if possible. Eat seafood two or more times a week. Have healthy snacks on hand. These may include: Vegetable sticks with hummus. Greek yogurt. Fruit and nut trail mix. Eat balanced meals. These should include: Fruit: 2-3 servings a day. Vegetables: 4-5 servings a day. Low-fat dairy: 2 servings a day. Fish, poultry, or lean meat: 1 serving a day. Beans and legumes: 2 or more servings a week. Nuts and seeds: 1-2 servings a day. Whole grains: 6-8 servings a day. Extra-virgin olive oil: 3-4 servings a day. Limit red meat and sweets to just a few servings a month. Lifestyle  Try to cook and eat meals with your family. Drink enough fluid to keep your pee (urine) pale yellow. Be active every day. This includes: Aerobic exercise, which is exercise that causes your heart to beat faster. Examples include running and swimming. Leisure activities like gardening, walking, or housework. Get 7-8 hours of sleep each night. Drink red wine if your provider says you can. A glass of wine is 5 oz (150 mL). You may be allowed to have: Up to 1 glass a day if you're male and not pregnant. Up to 2 glasses a day if you're male. What foods should I eat? Fruits Apples. Apricots. Avocado.  Berries. Bananas. Cherries. Dates. Figs. Grapes. Lemons. Melon. Oranges. Peaches. Plums. Pomegranate. Vegetables Artichokes. Beets. Broccoli. Cabbage. Carrots. Eggplant. Green beans. Chard. Kale. Spinach. Onions. Leeks. Peas. Squash. Tomatoes. Peppers. Radishes. Grains Whole-grain pasta. Brown rice. Bulgur wheat. Polenta. Couscous. Whole-wheat bread. Orpah Cobb. Meats and other  proteins Beans. Almonds. Sunflower seeds. Pine nuts. Peanuts. Cod. Salmon. Scallops. Shrimp. Tuna. Tilapia. Clams. Oysters. Eggs. Chicken or [place] without skin. Dairy Low-fat milk. Cheese. Greek yogurt. Fats and oils Extra-virgin olive oil. Avocado oil. Grapeseed oil. Beverages Water. Red wine. Herbal tea. Sweets and desserts Greek yogurt with honey. Baked apples. Poached pears. Trail mix. Seasonings and condiments Basil. Cilantro. Coriander. Cumin. Mint. Parsley. Sage. Rosemary. Tarragon. Garlic. Oregano. Thyme. Pepper. Balsamic vinegar. Tahini. Hummus. Tomato sauce. Olives. Mushrooms. The items listed above may not be all the foods and drinks you can have. Talk to a dietitian to learn more. What foods should I limit? This is a list of foods that should be eaten rarely. Fruits Fruit canned in syrup. Vegetables Deep-fried potatoes, like [place] fries. Grains Packaged pasta or rice dishes. Cereal with added sugar. Snacks with added sugar. Meats and other proteins Beef. Pork. Lamb. Chicken or [place] with skin. Hot dogs. Tomasa Blase. Dairy Ice cream. Sour cream. Whole milk. Fats and oils Butter. Canola oil. Vegetable oil. Beef fat (tallow). Lard. Beverages Juice. Sugar-sweetened soft drinks. Beer. Liquor and spirits. Sweets and desserts Cookies. Cakes. Pies. Candy. Seasonings and condiments Mayonnaise. Pre-made sauces and marinades. The items listed above may not be all the foods and drinks you should limit. Talk to a dietitian to learn more. Where to find more information American Heart Association (AHA): heart.org This information is not intended to replace advice given to you by your health care provider. Make sure you discuss any questions you have with your health care provider. Document Revised: 10/20/2022 Document Reviewed: 10/20/2022 Elsevier Patient Education  2024 [employer].

## 2023-11-13 NOTE — Assessment & Plan Note (Signed)
 Some shortness of breath. Lungs clear on exam. Seasonal allergies likely trigger for symptoms. History of childhood asthma and allergies.  -Continue Breo Ellipta  as prescribed. -Use AirSupra  instead of albuterol  inhaler -Add cetirizine  and Singulair  for maintenance and prevention of flare-ups.

## 2023-11-13 NOTE — Assessment & Plan Note (Signed)
 Hyperlipidemia  Well controlled on current medication Lab Results  Component Value Date   LDLCALC 69 07/04/2023  - Continue Lipitor 10 mg once daily @ bedtime  Diabetes Type 2 Continues on Ozempic  with two refills remaining. Monitoring for side effects and ensuring proper usage. - Monitor blood glucose levels  - Continue metformin  500 mg once daily @ breakfast - Ensure proper usage of Ozempic , currently on 2 mg SQ once weekly (maintenance dose) .

## 2023-11-14 ENCOUNTER — Ambulatory Visit: Admitting: Podiatry

## 2023-11-14 ENCOUNTER — Other Ambulatory Visit: Payer: Self-pay | Admitting: Family Medicine

## 2023-11-14 DIAGNOSIS — M79675 Pain in left toe(s): Secondary | ICD-10-CM

## 2023-11-14 DIAGNOSIS — M79674 Pain in right toe(s): Secondary | ICD-10-CM | POA: Diagnosis not present

## 2023-11-14 DIAGNOSIS — B351 Tinea unguium: Secondary | ICD-10-CM | POA: Diagnosis not present

## 2023-11-14 DIAGNOSIS — E119 Type 2 diabetes mellitus without complications: Secondary | ICD-10-CM

## 2023-11-14 DIAGNOSIS — J302 Other seasonal allergic rhinitis: Secondary | ICD-10-CM

## 2023-11-14 DIAGNOSIS — R601 Generalized edema: Secondary | ICD-10-CM

## 2023-11-14 LAB — COMPREHENSIVE METABOLIC PANEL WITH GFR
ALT: 16 IU/L (ref 0–44)
AST: 18 IU/L (ref 0–40)
Albumin: 4.1 g/dL (ref 3.9–4.9)
Alkaline Phosphatase: 108 IU/L (ref 44–121)
BUN/Creatinine Ratio: 16 (ref 10–24)
BUN: 22 mg/dL (ref 8–27)
Bilirubin Total: 0.6 mg/dL (ref 0.0–1.2)
CO2: 24 mmol/L (ref 20–29)
Calcium: 9.2 mg/dL (ref 8.6–10.2)
Chloride: 97 mmol/L (ref 96–106)
Creatinine, Ser: 1.41 mg/dL — ABNORMAL HIGH (ref 0.76–1.27)
Globulin, Total: 2.7 g/dL (ref 1.5–4.5)
Glucose: 98 mg/dL (ref 70–99)
Potassium: 4.6 mmol/L (ref 3.5–5.2)
Sodium: 136 mmol/L (ref 134–144)
Total Protein: 6.8 g/dL (ref 6.0–8.5)
eGFR: 56 mL/min/{1.73_m2} — ABNORMAL LOW (ref >59)

## 2023-11-14 LAB — HEMOGLOBIN A1C
Est. average glucose Bld gHb Est-mCnc: 128 mg/dL
Hgb A1c MFr Bld: 6.1 % — ABNORMAL HIGH (ref 4.8–5.6)

## 2023-11-14 LAB — CBC WITH DIFFERENTIAL/PLATELET
Basophils Absolute: 0.1 10*3/uL (ref 0.0–0.2)
Basos: 1 %
EOS (ABSOLUTE): 0.5 10*3/uL — ABNORMAL HIGH (ref 0.0–0.4)
Eos: 6 %
Hematocrit: 40.6 % (ref 37.5–51.0)
Hemoglobin: 12.9 g/dL — ABNORMAL LOW (ref 13.0–17.7)
Immature Grans (Abs): 0 10*3/uL (ref 0.0–0.1)
Immature Granulocytes: 1 %
Lymphocytes Absolute: 1.6 10*3/uL (ref 0.7–3.1)
Lymphs: 19 %
MCH: 27.9 pg (ref 26.6–33.0)
MCHC: 31.8 g/dL (ref 31.5–35.7)
MCV: 88 fL (ref 79–97)
Monocytes Absolute: 1.1 10*3/uL — ABNORMAL HIGH (ref 0.1–0.9)
Monocytes: 13 %
Neutrophils Absolute: 4.9 10*3/uL (ref 1.4–7.0)
Neutrophils: 60 %
Platelets: 371 10*3/uL (ref 150–450)
RBC: 4.63 x10E6/uL (ref 4.14–5.80)
RDW: 14.1 % (ref 11.6–15.4)
WBC: 8.1 10*3/uL (ref 3.4–10.8)

## 2023-11-14 LAB — MICROALBUMIN / CREATININE URINE RATIO
Creatinine, Urine: 138.8 mg/dL
Microalb/Creat Ratio: <2 mg/g{creat} (ref 0–29)
Microalbumin, Urine: <3 ug/mL

## 2023-11-14 LAB — LIPID PANEL
Chol/HDL Ratio: 3.8 ratio (ref 0.0–5.0)
Cholesterol, Total: 105 mg/dL (ref 100–199)
HDL: 28 mg/dL — ABNORMAL LOW (ref >39)
LDL Chol Calc (NIH): 55 mg/dL (ref 0–99)
Triglycerides: 120 mg/dL (ref 0–149)
VLDL Cholesterol Cal: 22 mg/dL (ref 5–40)

## 2023-11-14 LAB — VITAMIN D 25 HYDROXY (VIT D DEFICIENCY, FRACTURES): Vit D, 25-Hydroxy: 33.9 ng/mL (ref 30.0–100.0)

## 2023-11-14 NOTE — Progress Notes (Signed)
       Subjective:  Patient ID: Victor Hamilton, male    DOB: 1961/05/24,  MRN: [id number]  Nancie Axe presents to clinic today for:  Chief Complaint  Patient presents with   Diabetes    NP here today to establish [facility] and fungal nails. He is newly DX with DM, and wants to make sure his feet are taken care of. Nails are thick, long and very discolored. Last A1c was 6.8 in Dec, he had new blood work drawn yesterday 11/13/23/ he takes ASA 81   This diabetic male patient notes nails are thick, discolored, elongated and painful in shoegear when trying to ambulate.  He does want to treat the nail fungus and is open to having a fungal nail culture obtained today.  Denies any numbness or burning in his feet.  He also notes issues with swelling in his legs.  He wears compression knee-high socks.  PCP is Janece Means, FNP.  Past Medical History:  Diagnosis Date   Morbid obesity due to excess calories (HCC)    Obstructive sleep apnea (adult) (pediatric)     No past surgical history on file.  No Known Allergies  Review of Systems: Negative except as noted in the HPI.  Objective:  ARVON LEWELLYN is a pleasant 63 y.o. male in NAD. AAO x 3.  Vascular Examination: Capillary refill time is 3-5 seconds to toes bilateral. Palpable pedal pulses b/l LE. Digital hair sparse b/l.  Skin temperature gradient WNL b/l.  +2 pitting edema in the bilateral legs.  It does and at the ankle and does not proceed into the foot bilateral.  Positive telangiectasias in both legs.  Dermatological Examination: Pedal skin with normal turgor, texture and tone b/l. No open wounds. No interdigital macerations b/l. Toenails x10 are 3mm thick, discolored, dystrophic with subungual debris. There is pain with compression of the nail plates.  They are elongated x10  Neurological Examination: Protective sensation intact bilateral LE. Vibratory sensation intact bilateral LE.     Latest Ref Rng & Units 11/13/2023    9:01 AM  07/04/2023    8:54 AM  Hemoglobin A1C  Hemoglobin-A1c 4.8 - 5.6 % 6.1  6.8    Assessment/Plan: 1. Fungal nail infection   2. Generalized edema   3. Pain due to onychomycosis of toenails of both feet    The mycotic toenails were sharply debrided x10 with sterile nail nippers and a power debriding burr to decrease bulk/thickness and length.  Clippings of the hallux nails were obtained and sent for fungal nail culture to [facility].  Will call patient with results and discuss treatment plan.  Patient to continue with compression stockings that are knee-high.  If this is not successful he may be a candidate for Amer-X Velcro leg wraps or lymphedema pumps  Return in about 3 months (around 02/13/2024) for [facility].   Joe Murders, DPM, FACFAS [facility]     [address] [place], [place] [address]                Office [phone]  Fax [phone]

## 2023-11-27 ENCOUNTER — Encounter: Payer: Self-pay | Admitting: Podiatry

## 2023-11-27 MED ORDER — TERBINAFINE HCL 250 MG PO TABS: 250.0000 mg | ORAL_TABLET | Freq: Every day | ORAL | 0 refills | Status: AC

## 2023-11-27 NOTE — Addendum Note (Signed)
 Addended byHaze List D on: 11/27/2023 07:47 AM   Modules accepted: Orders

## 2024-01-29 ENCOUNTER — Other Ambulatory Visit: Payer: Self-pay | Admitting: Family Medicine

## 2024-01-29 DIAGNOSIS — I5032 Chronic diastolic (congestive) heart failure: Secondary | ICD-10-CM

## 2024-02-13 ENCOUNTER — Other Ambulatory Visit: Payer: Self-pay | Admitting: Family Medicine

## 2024-02-13 ENCOUNTER — Ambulatory Visit: Payer: Self-pay | Admitting: Family Medicine

## 2024-02-13 DIAGNOSIS — E1169 Type 2 diabetes mellitus with other specified complication: Secondary | ICD-10-CM

## 2024-02-20 ENCOUNTER — Ambulatory Visit: Admitting: Podiatry

## 2024-04-14 ENCOUNTER — Other Ambulatory Visit: Payer: Self-pay | Admitting: Cardiology

## 2024-06-29 ENCOUNTER — Other Ambulatory Visit: Payer: Self-pay

## 2024-06-30 ENCOUNTER — Telehealth: Payer: Self-pay

## 2024-06-30 ENCOUNTER — Other Ambulatory Visit: Payer: Self-pay

## 2024-06-30 NOTE — Telephone Encounter (Signed)
 PA for ozempic  recevied. Patient is inactive with [employer] terminated coverage 12/2023 per pharmacy. Currently covered under [employer] B: [id number] Victor Hamilton [id number] ID: [id number]  Initiated PA via covermymeds through [employer]. Waiting for approval or denial. Key: [id number]
